# Patient Record
Sex: Female | Born: 1995 | Race: White | Hispanic: No | Marital: Married | Smoking: Former smoker
Health system: Southern US, Community
[De-identification: ages and names within clinical notes are randomized; demographics above are authoritative.]

## PROBLEM LIST (undated history)

## (undated) ENCOUNTER — Inpatient Hospital Stay: Payer: Self-pay

## (undated) ENCOUNTER — Ambulatory Visit: Discharge status: Against Medical Advice | Payer: Medicaid Other

## (undated) DIAGNOSIS — J45909 Unspecified asthma, uncomplicated: Secondary | ICD-10-CM

## (undated) DIAGNOSIS — K219 Gastro-esophageal reflux disease without esophagitis: Secondary | ICD-10-CM

## (undated) DIAGNOSIS — F99 Mental disorder, not otherwise specified: Secondary | ICD-10-CM

## (undated) DIAGNOSIS — E039 Hypothyroidism, unspecified: Secondary | ICD-10-CM

## (undated) DIAGNOSIS — O24419 Gestational diabetes mellitus in pregnancy, unspecified control: Secondary | ICD-10-CM

## (undated) DIAGNOSIS — F32A Depression, unspecified: Secondary | ICD-10-CM

## (undated) DIAGNOSIS — E119 Type 2 diabetes mellitus without complications: Secondary | ICD-10-CM

## (undated) DIAGNOSIS — F329 Major depressive disorder, single episode, unspecified: Secondary | ICD-10-CM

## (undated) DIAGNOSIS — F419 Anxiety disorder, unspecified: Secondary | ICD-10-CM

---
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
## 2013-01-18 ENCOUNTER — Emergency Department: Payer: Self-pay | Admitting: Emergency Medicine

## 2013-01-18 LAB — CBC WITH DIFFERENTIAL/PLATELET
Basophil %: 0.5 %
Eosinophil #: 0.3 10*3/uL (ref 0.0–0.7)
HCT: 40.8 % (ref 35.0–47.0)
Lymphocyte %: 18 %
MCH: 30.7 pg (ref 26.0–34.0)
MCHC: 35 g/dL (ref 32.0–36.0)
Monocyte %: 5.6 %
Neutrophil #: 7.2 10*3/uL — ABNORMAL HIGH (ref 1.4–6.5)
Neutrophil %: 73 %
Platelet: 227 10*3/uL (ref 150–440)
RBC: 4.66 10*6/uL (ref 3.80–5.20)
WBC: 9.9 10*3/uL (ref 3.6–11.0)

## 2013-01-18 LAB — COMPREHENSIVE METABOLIC PANEL
Anion Gap: 5 — ABNORMAL LOW (ref 7–16)
BUN: 12 mg/dL (ref 7–18)
Bilirubin,Total: 0.4 mg/dL (ref 0.2–1.0)
Calcium, Total: 9.1 mg/dL (ref 9.0–10.7)
Chloride: 109 mmol/L — ABNORMAL HIGH (ref 98–107)
Creatinine: 1.08 mg/dL (ref 0.60–1.30)
Glucose: 100 mg/dL — ABNORMAL HIGH (ref 65–99)
SGOT(AST): 36 U/L — ABNORMAL HIGH (ref 0–26)
Total Protein: 7.9 g/dL (ref 6.4–8.6)

## 2013-01-18 LAB — URINALYSIS, COMPLETE
Bacteria: NONE SEEN
Blood: NEGATIVE
Ketone: NEGATIVE
Leukocyte Esterase: NEGATIVE
Nitrite: NEGATIVE
Ph: 6 (ref 4.5–8.0)
Protein: NEGATIVE
RBC,UR: 2 /HPF (ref 0–5)
Specific Gravity: 1.025 (ref 1.003–1.030)

## 2013-01-18 LAB — LIPASE, BLOOD: Lipase: 229 U/L (ref 73–393)

## 2013-03-04 ENCOUNTER — Encounter: Payer: Self-pay | Admitting: Obstetrics & Gynecology

## 2013-04-15 ENCOUNTER — Encounter: Payer: Self-pay | Admitting: Obstetrics and Gynecology

## 2013-04-29 ENCOUNTER — Encounter: Payer: Self-pay | Admitting: Obstetrics and Gynecology

## 2013-04-29 LAB — CBC WITH DIFFERENTIAL/PLATELET
BASOS PCT: 0.2 %
Basophil #: 0 10*3/uL (ref 0.0–0.1)
EOS PCT: 2.2 %
Eosinophil #: 0.1 10*3/uL (ref 0.0–0.7)
HCT: 40 % (ref 35.0–47.0)
HGB: 14 g/dL (ref 12.0–16.0)
LYMPHS PCT: 24.6 %
Lymphocyte #: 1.4 10*3/uL (ref 1.0–3.6)
MCH: 31.5 pg (ref 26.0–34.0)
MCHC: 34.9 g/dL (ref 32.0–36.0)
MCV: 90 fL (ref 80–100)
MONOS PCT: 6.4 %
Monocyte #: 0.4 x10 3/mm (ref 0.2–0.9)
Neutrophil #: 3.8 10*3/uL (ref 1.4–6.5)
Neutrophil %: 66.6 %
Platelet: 202 10*3/uL (ref 150–440)
RBC: 4.43 10*6/uL (ref 3.80–5.20)
RDW: 13.6 % (ref 11.5–14.5)
WBC: 5.8 10*3/uL (ref 3.6–11.0)

## 2013-04-29 LAB — BASIC METABOLIC PANEL
ANION GAP: 7 (ref 7–16)
BUN: 9 mg/dL (ref 7–18)
Calcium, Total: 9.8 mg/dL (ref 9.0–10.7)
Chloride: 104 mmol/L (ref 98–107)
Co2: 25 mmol/L (ref 21–32)
Creatinine: 0.62 mg/dL (ref 0.60–1.30)
EGFR (African American): >60
GLUCOSE: 80 mg/dL (ref 65–99)
Osmolality: 270 (ref 275–301)
POTASSIUM: 3.9 mmol/L (ref 3.5–5.1)
SODIUM: 136 mmol/L (ref 136–145)

## 2013-06-04 ENCOUNTER — Emergency Department: Payer: Self-pay | Admitting: Emergency Medicine

## 2013-08-04 ENCOUNTER — Observation Stay: Payer: Self-pay | Admitting: Obstetrics and Gynecology

## 2013-09-17 ENCOUNTER — Ambulatory Visit: Payer: Self-pay | Admitting: Obstetrics & Gynecology

## 2013-09-17 LAB — CBC WITH DIFFERENTIAL/PLATELET
BASOS ABS: 0 10*3/uL (ref 0.0–0.1)
Basophil %: 0.2 %
Eosinophil #: 0.1 10*3/uL (ref 0.0–0.7)
Eosinophil %: 0.9 %
HCT: 34.4 % — ABNORMAL LOW (ref 35.0–47.0)
HGB: 11.6 g/dL — ABNORMAL LOW (ref 12.0–16.0)
LYMPHS ABS: 1.4 10*3/uL (ref 1.0–3.6)
LYMPHS PCT: 17.1 %
MCH: 32 pg (ref 26.0–34.0)
MCHC: 33.6 g/dL (ref 32.0–36.0)
MCV: 95 fL (ref 80–100)
MONOS PCT: 4.6 %
Monocyte #: 0.4 x10 3/mm (ref 0.2–0.9)
NEUTROS ABS: 6.5 10*3/uL (ref 1.4–6.5)
Neutrophil %: 77.2 %
PLATELETS: 198 10*3/uL (ref 150–440)
RBC: 3.62 10*6/uL — AB (ref 3.80–5.20)
RDW: 13.8 % (ref 11.5–14.5)
WBC: 8.4 10*3/uL (ref 3.6–11.0)

## 2013-09-17 LAB — RAPID HIV SCREEN (HIV 1/2 AB+AG)

## 2013-09-18 ENCOUNTER — Inpatient Hospital Stay: Payer: Self-pay | Admitting: Obstetrics & Gynecology

## 2013-09-18 LAB — GC/CHLAMYDIA PROBE AMP

## 2013-09-19 LAB — HEMATOCRIT: HCT: 28.9 % — AB (ref 35.0–47.0)

## 2014-06-14 NOTE — Op Note (Signed)
PATIENT NAME:  Shirley Hayes, Shirley Hayes MR#:  [id number] DATE OF BIRTH:  [birth year]  DATE OF PROCEDURE:  09/18/2013  PREOPERATIVE DIAGNOSIS:  Term pregnancy, breech presentation.  POSTOPERATIVE DIAGNOSIS: Term pregnancy, breech presentation.   PROCEDURE PERFORMED:  1.  Low transverse cesarean section.  2.  Placement of On-Q pain pump.   SURGEON: Annamarie Major, M.D.   ASSISTANT: [address]   ANESTHESIA: Spinal.   ESTIMATED BLOOD LOSS: 250 mL.   COMPLICATIONS: None.   FINDINGS: Normal tubes, ovaries, and uterus. Viable female infant weighing 7 pounds, 7 ounces with Apgars 8 and 9 at 1 and 10 minutes respectively in the frank breech presentation.   DISPOSITION: To the recovery room in stable condition.   TECHNIQUE: The patient is prepped and draped in the usual sterile fashion after adequate anesthesia is obtained. In the supine position on the operating room table, a scalpel was used to create a low transverse skin incision and the rectus fascia was dissected bilaterally using Mayo scissors. The rectus muscles are separated and peritoneum is penetrated, and the bladder is inferiorly dissected and retracted. A scalpel was used to create a low transverse hysterotomy incision that is then extended by blunt dissection. Amniotomy then reveals clear fluid. The buttocks are delivered in a sacrum anterior position and gentle traction is performed with the legs easily delivered as well. Once the axilla is reached, the arms are carefully swept across the chest and the head is easily delivered. The oropharynx is suctioned and umbilical cord is clamped and cut and the infant handed to the pediatric team.   Cord blood is obtained and the placenta is manually extracted. The uterus is externalized and cleansed of all membranes and debris using a moist sponge. Hysterotomy incision is closed with a running #1 Vicryl suture in a locking fashion with excellent hemostasis noted. Uterus was placed back in the  intra-abdominal cavity and the paracolic gutters are irrigated with warm saline. Re-examination reveals excellent hemostasis.   The peritoneum is closed with a Vicryl suture. Trocars are placed through the abdomen into the subfascial space and then the Silver soaker catheters associated with the On-Q pain pump are placed. The rectus fascia is closed with 0 Maxon suture with careful placement not to incorporate these catheters. The catheters are flushed with 5 mL each of bupivacaine and stabilized in place with Steri-Strips and a bandage.   Subcutaneous tissues are irrigated and hemostasis is assured using electrocautery. The skin is closed with 4-0 Vicryl suture in a subcuticular fashion followed by placement of a skin glue. The patient goes to the recovery room in stable condition. All sponge, instrument, and needle counts are correct.   ____________________________ R. Annamarie Major, MD rph:ts D: 09/18/2013 09:47:00 ET T: 09/18/2013 11:42:27 ET JOB#: [id number]  cc: Dierdre Searles, MD, <Dictator> Nadara Mustard MD ELECTRONICALLY SIGNED 09/20/2013 10:04

## 2014-09-29 ENCOUNTER — Ambulatory Visit: Payer: Self-pay | Admitting: General Surgery

## 2014-10-29 ENCOUNTER — Encounter: Payer: Self-pay | Admitting: *Deleted

## 2014-12-24 ENCOUNTER — Encounter: Payer: Self-pay | Admitting: *Deleted

## 2014-12-29 ENCOUNTER — Ambulatory Visit (INDEPENDENT_AMBULATORY_CARE_PROVIDER_SITE_OTHER): Payer: Medicaid Other | Admitting: General Surgery

## 2014-12-29 ENCOUNTER — Encounter: Payer: Self-pay | Admitting: General Surgery

## 2014-12-29 VITALS — BP 130/74 | HR 80 | Resp 12 | Ht 68.0 in | Wt 318.0 lb

## 2014-12-29 DIAGNOSIS — K8020 Calculus of gallbladder without cholecystitis without obstruction: Secondary | ICD-10-CM

## 2014-12-29 NOTE — Patient Instructions (Addendum)
Cholelithiasis Cholelithiasis (also called gallstones) is a form of gallbladder disease in which gallstones form in your gallbladder. The gallbladder is an organ that stores bile made in the liver, which helps digest fats. Gallstones begin as small crystals and slowly grow into stones. Gallstone pain occurs when the gallbladder spasms and a gallstone is blocking the duct. Pain can also occur when a stone passes out of the duct.  RISK FACTORS  Being female.   Having multiple pregnancies. Health care providers sometimes advise removing diseased gallbladders before future pregnancies.   Being obese.  Eating a diet heavy in fried foods and fat.   Being older than 60 years and increasing age.   Prolonged use of medicines containing female hormones.   Having diabetes mellitus.   Rapidly losing weight.   Having a family history of gallstones (heredity).  SYMPTOMS  Nausea.   Vomiting.  Abdominal pain.   Yellowing of the skin (jaundice).   Sudden pain. It may persist from several minutes to several hours.  Fever.   Tenderness to the touch. In some cases, when gallstones do not move into the bile duct, people have no pain or symptoms. These are called "silent" gallstones.  TREATMENT Silent gallstones do not need treatment. In severe cases, emergency surgery may be required. Options for treatment include:  Surgery to remove the gallbladder. This is the most common treatment.  Medicines. These do not always work and may take 6-12 months or more to work.  Shock wave treatment (extracorporeal biliary lithotripsy). In this treatment an ultrasound machine sends shock waves to the gallbladder to break gallstones into smaller pieces that can pass into the intestines or be dissolved by medicine. HOME CARE INSTRUCTIONS   Only take over-the-counter or prescription medicines for pain, discomfort, or fever as directed by your health care provider.   Follow a low-fat diet until  seen again by your health care provider. Fat causes the gallbladder to contract, which can result in pain.   Follow up with your health care provider as directed. Attacks are almost always recurrent and surgery is usually required for permanent treatment.  SEEK IMMEDIATE MEDICAL CARE IF:   Your pain increases and is not controlled by medicines.   You have a fever or persistent symptoms for more than 2-3 days.   You have a fever and your symptoms suddenly get worse.   You have persistent nausea and vomiting.  MAKE SURE YOU:   Understand these instructions.  Will watch your condition.  Will get help right away if you are not doing well or get worse.   This information is not intended to replace advice given to you by your health care provider. Make sure you discuss any questions you have with your health care provider.   Document Released: 02/03/2005 Document Revised: 10/10/2012 Document Reviewed: 08/01/2012 Elsevier Interactive Patient Education 2016 [employer].  Patient's surgery has been scheduled for 01-01-15 at [facility].

## 2014-12-29 NOTE — Progress Notes (Signed)
Patient ID: Shirley Hayes, female   DOB: 03/16/1995, 19 y.o.   MRN: [id number]  Chief Complaint  Patient presents with  . Other    Gallstones    HPI Shirley Hayes is a 19 y.o. female here today for an evaluation for gallstones.She had a ultrasound done on 09/10/2014. Patient has been having abdominal pain for three years off and on. Salads and greasy food trigger her pain. The pain last for about 30 min and radiates from RUQ to her back. Her first episode in July of this year lasted 4 hours. Subsequent ultrasound confirmed gallstones.  I have reviewed the history of present illness with the patient. HPI  History reviewed. No pertinent past medical history.  Past Surgical History  Procedure Laterality Date  . Cesarean section  [id number]  . Laparoscopy  02/22/2012    History reviewed. No pertinent family history.  Social History Social History  Substance Use Topics  . Smoking status: Current Every Day Smoker -- 1.00 packs/day for 3 years    Types: Cigarettes  . Smokeless tobacco: Never Used  . Alcohol Use: No    No Known Allergies  No current outpatient prescriptions on file.   No current facility-administered medications for this visit.    Review of Systems Review of Systems  Constitutional: Negative.   Respiratory: Negative.   Cardiovascular: Negative.   Gastrointestinal: Positive for abdominal pain.    Blood pressure 130/74, pulse 80, resp. rate 12, height 5' 8" (1.727 m), weight 318 lb (144.244 kg), last menstrual period 11/28/2014.  Physical Exam Physical Exam  Constitutional: She is oriented to person, place, and time. She appears well-developed and well-nourished.  Eyes: Conjunctivae are normal. No scleral icterus.  Neck: Neck supple.  Cardiovascular: Normal rate, regular rhythm and normal heart sounds.   Pulmonary/Chest: Effort normal.  Abdominal: Soft. Normal appearance and bowel sounds are normal. There is no hepatomegaly. There is no  tenderness.  Lymphadenopathy:    She has no cervical adenopathy.  Neurological: She is alert and oriented to person, place, and time.  Skin: Skin is warm and dry.    Data Reviewed Progress notes and ultrasound reviewed.  Assessment    Cholelithiasis, confirmed with recent ultrasound revealing gallstones. Patient is recurrently symptomatic with biliary colic. Options of management discussed, recommended laparoscopy and cholecystectomy.       Plan    Laparoscopic Cholecystectomy with Intraoperative Cholangiogram. The procedure, including it's potential risks and complications (including but not limited to infection, bleeding, injury to intra-abdominal organs or bile ducts, bile leak, poor cosmetic result, sepsis and death) were discussed with the patient in detail. Non-operative options, including their inherent risks (acute calculous cholecystitis with possible choledocholithiasis or gallstone pancreatitis, with the risk of ascending cholangitis, sepsis, and death) were discussed as well. The patient expressed and understanding of what we discussed and wishes to proceed with laparoscopic cholecystectomy. The patient further understands that if it is technically not possible, or it is unsafe to proceed laparoscopically, that I will convert to an open cholecystectomy.     PCP: Chelsa Holland, MD  Jowana Thumma G 12/29/2014, 11:40 AM    

## 2014-12-29 NOTE — Addendum Note (Signed)
Addended by: Kieth Brightly on: 12/29/2014 11:53 AM   Modules accepted: Orders

## 2014-12-30 ENCOUNTER — Other Ambulatory Visit: Payer: Self-pay

## 2015-01-01 ENCOUNTER — Encounter
Admission: RE | Disposition: A | Discharge status: Home / Self Care | Payer: Self-pay | Source: Ambulatory Visit | Attending: General Surgery

## 2015-01-01 ENCOUNTER — Ambulatory Visit
Admission: RE | Admit: 2015-01-01 | Discharge: 2015-01-01 | Disposition: A | Discharge status: Home / Self Care | Payer: Medicaid Other | Source: Ambulatory Visit | Attending: General Surgery | Admitting: General Surgery

## 2015-01-01 ENCOUNTER — Ambulatory Visit: Payer: Medicaid Other

## 2015-01-01 ENCOUNTER — Ambulatory Visit: Payer: Medicaid Other | Admitting: Anesthesiology

## 2015-01-01 ENCOUNTER — Encounter: Payer: Self-pay | Admitting: *Deleted

## 2015-01-01 DIAGNOSIS — K8010 Calculus of gallbladder with chronic cholecystitis without obstruction: Secondary | ICD-10-CM

## 2015-01-01 DIAGNOSIS — F17210 Nicotine dependence, cigarettes, uncomplicated: Secondary | ICD-10-CM | POA: Insufficient documentation

## 2015-01-01 DIAGNOSIS — K439 Ventral hernia without obstruction or gangrene: Secondary | ICD-10-CM | POA: Insufficient documentation

## 2015-01-01 DIAGNOSIS — K8020 Calculus of gallbladder without cholecystitis without obstruction: Secondary | ICD-10-CM

## 2015-01-01 DIAGNOSIS — Z6841 Body Mass Index (BMI) 40.0 and over, adult: Secondary | ICD-10-CM | POA: Insufficient documentation

## 2015-01-01 DIAGNOSIS — K219 Gastro-esophageal reflux disease without esophagitis: Secondary | ICD-10-CM | POA: Insufficient documentation

## 2015-01-01 DIAGNOSIS — J45909 Unspecified asthma, uncomplicated: Secondary | ICD-10-CM | POA: Diagnosis not present

## 2015-01-01 HISTORY — PX: CHOLECYSTECTOMY: SHX55

## 2015-01-01 LAB — POCT PREGNANCY, URINE: Preg Test, Ur: NEGATIVE

## 2015-01-01 SURGERY — LAPAROSCOPIC CHOLECYSTECTOMY WITH INTRAOPERATIVE CHOLANGIOGRAM
Anesthesia: General | Site: Abdomen | Wound class: Clean Contaminated

## 2015-01-01 MED ORDER — CEFAZOLIN SODIUM 10 G IJ SOLR
3.0000 g | INTRAMUSCULAR | Status: AC
  Administered 2015-01-01: 3 g via INTRAVENOUS
  Filled 2015-01-01: qty 3000

## 2015-01-01 MED ORDER — SUCCINYLCHOLINE CHLORIDE 20 MG/ML IJ SOLN
INTRAMUSCULAR | Status: DC | PRN
  Administered 2015-01-01: 100 mg via INTRAVENOUS

## 2015-01-01 MED ORDER — NEOSTIGMINE METHYLSULFATE 10 MG/10ML IV SOLN
INTRAVENOUS | Status: DC | PRN
  Administered 2015-01-01: 4 mg via INTRAVENOUS

## 2015-01-01 MED ORDER — OXYCODONE-ACETAMINOPHEN 5-325 MG PO TABS
ORAL_TABLET | ORAL | Status: AC
  Filled 2015-01-01: qty 1

## 2015-01-01 MED ORDER — ACETAMINOPHEN 10 MG/ML IV SOLN
INTRAVENOUS | Status: AC
  Filled 2015-01-01: qty 100

## 2015-01-01 MED ORDER — MIDAZOLAM HCL 5 MG/5ML IJ SOLN
INTRAMUSCULAR | Status: DC | PRN
Start: 2015-01-01 — End: 2015-01-01
  Administered 2015-01-01: 2 mg via INTRAVENOUS

## 2015-01-01 MED ORDER — SODIUM CHLORIDE 0.9 % IV SOLN
INTRAVENOUS | Status: DC | PRN
  Administered 2015-01-01: 10 mL

## 2015-01-01 MED ORDER — GLYCOPYRROLATE 0.2 MG/ML IJ SOLN
INTRAMUSCULAR | Status: DC | PRN
  Administered 2015-01-01: 0.6 mg via INTRAVENOUS

## 2015-01-01 MED ORDER — ROCURONIUM BROMIDE 100 MG/10ML IV SOLN
INTRAVENOUS | Status: DC | PRN
  Administered 2015-01-01: 30 mg via INTRAVENOUS
  Administered 2015-01-01 (×2): 10 mg via INTRAVENOUS

## 2015-01-01 MED ORDER — FENTANYL CITRATE (PF) 100 MCG/2ML IJ SOLN
25.0000 ug | INTRAMUSCULAR | Status: DC | PRN
  Administered 2015-01-01 (×2): 25 ug via INTRAVENOUS

## 2015-01-01 MED ORDER — DEXAMETHASONE SODIUM PHOSPHATE 10 MG/ML IJ SOLN
INTRAMUSCULAR | Status: DC | PRN
  Administered 2015-01-01: 8 mg via INTRAVENOUS

## 2015-01-01 MED ORDER — LIDOCAINE HCL (CARDIAC) 20 MG/ML IV SOLN
INTRAVENOUS | Status: DC | PRN
  Administered 2015-01-01: 80 mg via INTRAVENOUS

## 2015-01-01 MED ORDER — OXYCODONE-ACETAMINOPHEN 5-325 MG PO TABS
1.0000 | ORAL_TABLET | ORAL | Status: DC | PRN
  Administered 2015-01-01: 1 via ORAL

## 2015-01-01 MED ORDER — ONDANSETRON HCL 4 MG/2ML IJ SOLN
INTRAMUSCULAR | Status: DC | PRN
  Administered 2015-01-01: 4 mg via INTRAVENOUS

## 2015-01-01 MED ORDER — FAMOTIDINE 20 MG PO TABS
20.0000 mg | ORAL_TABLET | Freq: Once | ORAL | Status: AC
  Administered 2015-01-01: 20 mg via ORAL

## 2015-01-01 MED ORDER — ACETAMINOPHEN 10 MG/ML IV SOLN
INTRAVENOUS | Status: DC | PRN
  Administered 2015-01-01: 1000 mg via INTRAVENOUS

## 2015-01-01 MED ORDER — FENTANYL CITRATE (PF) 250 MCG/5ML IJ SOLN
INTRAMUSCULAR | Status: DC | PRN
  Administered 2015-01-01 (×6): 50 ug via INTRAVENOUS

## 2015-01-01 MED ORDER — PROMETHAZINE HCL 25 MG/ML IJ SOLN: 6.2500 mg | INTRAMUSCULAR | Status: DC | PRN

## 2015-01-01 MED ORDER — LACTATED RINGERS IV SOLN
INTRAVENOUS | Status: DC
  Administered 2015-01-01 (×2): via INTRAVENOUS

## 2015-01-01 MED ORDER — FENTANYL CITRATE (PF) 100 MCG/2ML IJ SOLN
INTRAMUSCULAR | Status: AC
  Administered 2015-01-01: 25 ug via INTRAVENOUS
  Filled 2015-01-01: qty 2

## 2015-01-01 MED ORDER — SODIUM CHLORIDE 0.9 % IJ SOLN
INTRAMUSCULAR | Status: AC
  Filled 2015-01-01: qty 50

## 2015-01-01 MED ORDER — KETOROLAC TROMETHAMINE 30 MG/ML IJ SOLN
INTRAMUSCULAR | Status: DC | PRN
  Administered 2015-01-01: 30 mg via INTRAVENOUS

## 2015-01-01 MED ORDER — PROPOFOL 10 MG/ML IV BOLUS
INTRAVENOUS | Status: DC | PRN
  Administered 2015-01-01: 200 mg via INTRAVENOUS

## 2015-01-01 MED ORDER — FAMOTIDINE 20 MG PO TABS
ORAL_TABLET | ORAL | Status: AC
  Administered 2015-01-01: 20 mg via ORAL
  Filled 2015-01-01: qty 1

## 2015-01-01 MED ORDER — OXYCODONE-ACETAMINOPHEN 5-325 MG PO TABS: 1.0000 | ORAL_TABLET | ORAL | Status: DC | PRN

## 2015-01-01 MED ORDER — LACTATED RINGERS IR SOLN
Status: DC | PRN
  Administered 2015-01-01: 500 mL

## 2015-01-01 SURGICAL SUPPLY — 39 items
ANCHOR TIS RET SYS 235ML (MISCELLANEOUS) ×3 IMPLANT
APPLICATOR SURGIFLO (MISCELLANEOUS) IMPLANT
APPLIER CLIP LOGIC TI 5 (MISCELLANEOUS) ×3 IMPLANT
BLADE SURG 11 STRL SS SAFETY (MISCELLANEOUS) ×3 IMPLANT
CANISTER SUCT 1200ML W/VALVE (MISCELLANEOUS) ×3 IMPLANT
CANNULA DILATOR 10 W/SLV (CANNULA) ×2 IMPLANT
CANNULA DILATOR 10MM W/SLV (CANNULA) ×1
CATH CHOLANG 76X19 KUMAR (CATHETERS) ×3 IMPLANT
CHLORAPREP W/TINT 26ML (MISCELLANEOUS) ×3 IMPLANT
CLOSURE WOUND 1/2 X4 (GAUZE/BANDAGES/DRESSINGS) ×1
DEFOGGER SCOPE WARMER CLEARIFY (MISCELLANEOUS) ×3 IMPLANT
DEVICE SECURE STRAP 25 ABSORB (INSTRUMENTS) ×3 IMPLANT
DRAPE C-ARM XRAY 36X54 (DRAPES) ×3 IMPLANT
DRAPE INCISE IOBAN 66X45 STRL (DRAPES) ×3 IMPLANT
DRESSING TELFA 4X3 1S ST N-ADH (GAUZE/BANDAGES/DRESSINGS) ×3 IMPLANT
DRSG TEGADERM 2-3/8X2-3/4 SM (GAUZE/BANDAGES/DRESSINGS) ×12 IMPLANT
GLOVE BIO SURGEON STRL SZ7 (GLOVE) ×3 IMPLANT
GOWN STRL REUS W/ TWL LRG LVL3 (GOWN DISPOSABLE) ×3 IMPLANT
GOWN STRL REUS W/TWL LRG LVL3 (GOWN DISPOSABLE) ×6
GRASPER SUT TROCAR 14GX15 (MISCELLANEOUS) ×3 IMPLANT
HEMOSTAT SURGICEL 2X3 (HEMOSTASIS) IMPLANT
IRRIGATION STRYKERFLOW (MISCELLANEOUS) ×1 IMPLANT
IRRIGATOR STRYKERFLOW (MISCELLANEOUS) ×3
IV LACTATED RINGERS 1000ML (IV SOLUTION) ×3 IMPLANT
KIT RM TURNOVER STRD PROC AR (KITS) ×3 IMPLANT
LABEL OR SOLS (LABEL) ×3 IMPLANT
NDL INSUFF ACCESS 14 VERSASTEP (NEEDLE) ×3 IMPLANT
PACK LAP CHOLECYSTECTOMY (MISCELLANEOUS) ×3 IMPLANT
PAD GROUND ADULT SPLIT (MISCELLANEOUS) ×3 IMPLANT
SCISSORS METZENBAUM CVD 33 (INSTRUMENTS) ×3 IMPLANT
SLEEVE ENDOPATH XCEL 5M (ENDOMECHANICALS) ×6 IMPLANT
SPOGE SURGIFLO 8M (HEMOSTASIS)
SPONGE SURGIFLO 8M (HEMOSTASIS) IMPLANT
STRIP CLOSURE SKIN 1/2X4 (GAUZE/BANDAGES/DRESSINGS) ×2 IMPLANT
SUT VIC AB 0 SH 27 (SUTURE) ×3 IMPLANT
SUT VIC AB 4-0 FS2 27 (SUTURE) ×6 IMPLANT
SWABSTK COMLB BENZOIN TINCTURE (MISCELLANEOUS) ×3 IMPLANT
TROCAR XCEL NON-BLD 5MMX100MML (ENDOMECHANICALS) ×3 IMPLANT
TUBING INSUFFLATOR HI FLOW (MISCELLANEOUS) ×3 IMPLANT

## 2015-01-01 NOTE — Interval H&P Note (Signed)
History and Physical Interval Note:  01/01/2015 10:35 AM  Shirley Hayes  has presented today for surgery, with the diagnosis of gallstones  The various methods of treatment have been discussed with the patient and family. After consideration of risks, benefits and other options for treatment, the patient has consented to  Procedure(s): LAPAROSCOPIC CHOLECYSTECTOMY WITH INTRAOPERATIVE CHOLANGIOGRAM (N/A) as a surgical intervention .  [name] history has been reviewed, patient examined, no change in status, stable for surgery.  I have reviewed the patient's chart and labs.  Questions were answered to the patient's satisfaction.     Mario Voong G

## 2015-01-01 NOTE — Progress Notes (Signed)
Negative urine pregnancy test.

## 2015-01-01 NOTE — Anesthesia Procedure Notes (Signed)
Procedure Name: Intubation Date/Time: 01/01/2015 11:10 AM Performed by: Chong Sicilian Pre-anesthesia Checklist: Patient identified, Emergency Drugs available, Suction available, Patient being monitored and Timeout performed Patient Re-evaluated:Patient Re-evaluated prior to inductionOxygen Delivery Method: Circle system utilized Preoxygenation: Pre-oxygenation with 100% oxygen Intubation Type: IV induction Ventilation: Mask ventilation without difficulty Laryngoscope Size: Mac and 3 Tube type: Oral Tube size: 7.0 mm Number of attempts: 1 Airway Equipment and Method: Stylet Placement Confirmation: ETT inserted through vocal cords under direct vision,  positive ETCO2 and breath sounds checked- equal and bilateral Secured at: 21 cm Tube secured with: Tape Dental Injury: Teeth and Oropharynx as per pre-operative assessment

## 2015-01-01 NOTE — Op Note (Signed)
Preop diagnosis: Cholelithiasis  Post op diagnosis: Same. Small ventral hernia lower midline  Operation: Laparoscopy cholecystectomy and intraoperative cholangiogram. Repair of a small ventral hernia  Surgeon: S.G.Jahid Weida    Assistant:     Anesthesia: Gen.  Complications: None  EBL: Less than 25 mL  Drains: None  Description: Patient was put to sleep in supine position the operating table. Abdomen was prepped and draped as sterile field. Timeout was performed. Initial port incision was made at the above the umbilicus and Veress needle   was position in the peritoneal cavity and verified of the hanging drop method. 10 mm port was placed after pneumoperitoneum was obtained. Camera was introduced and epigastric and 2 lateral 5 mm ports were placed. Gallbladder was mildly distended but had no adhesions or any chronic changes. With cephalad traction the Hartman's pouch was pulled up and angled scope was then used to visualize the cystic duct and common duct area. The cystic duct was then freed as also this cystic artery. Kumar clamp and catheter were positioned and cholangiogram was performed. The visualization was faint but didn't fill the cystic duct or hepatic duct and proximal radicals and also the common bile duct with no obstruction to flow into the duodenum. The catheter was used to decompress the gallbladder and then removed. Cystic duct and cystic artery were hemoclipped and cut gallbladder was dissected free from its bed using cautery for control of bleeding. The area was irrigated with small amount of saline and suctioned out. Clips were all noted be intact. The gallbladder was placed in a retrieval bag and brought out through the umbilical port site. It did contain now on a large 2 cm stone - of small stones packed. In the course of the visualize any umbilical port side to remove the gallbladder it was evident the patient had a small hernia within the peritoneum in the lower midline area  likely from a previous transverse pelvic incision. A small sleeve omentum was herniating into this. The defect was probably about a centimeter and a half long. It appeared to contain only the peritoneum and not so much the muscle anteriorly. This was subsequently closed with the use of secure strap. In view of the potentially contaminated surgery of mesh was not used in a sinus the hernial opening was fairly small and involved only the peritoneum. Fascial opening at the umbilicus were closed with 0 Vicryl in the skin incisions closed with subcuticular 4-0 Vicryl reinforced with Steri-Strips. Dry sterile dressings were placed and patient subsequently returned recovery room in stable condition.

## 2015-01-01 NOTE — H&P (View-Only) (Signed)
Patient ID: Shirley Hayes, female   DOB: 10-Jan-1996, 19 y.o.   MRN: [id number]  Chief Complaint  Patient presents with  . Other    Gallstones    HPI Shirley Hayes is a 19 y.o. female here today for an evaluation for gallstones.She had a ultrasound done on 09/10/2014. Patient has been having abdominal pain for three years off and on. Salads and greasy food trigger her pain. The pain last for about 30 min and radiates from RUQ to her back. Her first episode in July of this year lasted 4 hours. Subsequent ultrasound confirmed gallstones.  I have reviewed the history of present illness with the patient. HPI  History reviewed. No pertinent past medical history.  Past Surgical History  Procedure Laterality Date  . Cesarean section  [id number]  . Laparoscopy  02/22/2012    History reviewed. No pertinent family history.  Social History Social History  Substance Use Topics  . Smoking status: Current Every Day Smoker -- 1.00 packs/day for 3 years    Types: Cigarettes  . Smokeless tobacco: Never Used  . Alcohol Use: No    No Known Allergies  No current outpatient prescriptions on file.   No current facility-administered medications for this visit.    Review of Systems Review of Systems  Constitutional: Negative.   Respiratory: Negative.   Cardiovascular: Negative.   Gastrointestinal: Positive for abdominal pain.    Blood pressure 130/74, pulse 80, resp. rate 12, height 5\' 8"  (1.727 m), weight 318 lb (144.244 kg), last menstrual period 11/28/2014.  Physical Exam Physical Exam  Constitutional: She is oriented to person, place, and time. She appears well-developed and well-nourished.  Eyes: Conjunctivae are normal. No scleral icterus.  Neck: Neck supple.  Cardiovascular: Normal rate, regular rhythm and normal heart sounds.   Pulmonary/Chest: Effort normal.  Abdominal: Soft. Normal appearance and bowel sounds are normal. There is no hepatomegaly. There is no  tenderness.  Lymphadenopathy:    She has no cervical adenopathy.  Neurological: She is alert and oriented to person, place, and time.  Skin: Skin is warm and dry.    Data Reviewed Progress notes and ultrasound reviewed.  Assessment    Cholelithiasis, confirmed with recent ultrasound revealing gallstones. Patient is recurrently symptomatic with biliary colic. Options of management discussed, recommended laparoscopy and cholecystectomy.       Plan    Laparoscopic Cholecystectomy with Intraoperative Cholangiogram. The procedure, including it's potential risks and complications (including but not limited to infection, bleeding, injury to intra-abdominal organs or bile ducts, bile leak, poor cosmetic result, sepsis and death) were discussed with the patient in detail. Non-operative options, including their inherent risks (acute calculous cholecystitis with possible choledocholithiasis or gallstone pancreatitis, with the risk of ascending cholangitis, sepsis, and death) were discussed as well. The patient expressed and understanding of what we discussed and wishes to proceed with laparoscopic cholecystectomy. The patient further understands that if it is technically not possible, or it is unsafe to proceed laparoscopically, that I will convert to an open cholecystectomy.     PCP: Imelda Pillow, MD  Gerlene Burdock G 12/29/2014, 11:40 AM

## 2015-01-01 NOTE — Transfer of Care (Signed)
Immediate Anesthesia Transfer of Care Note  Patient: Shirley Hayes  Procedure(s) Performed: Procedure(s): LAPAROSCOPIC CHOLECYSTECTOMY WITH INTRAOPERATIVE CHOLANGIOGRAM (N/A)  Patient Location: PACU  Anesthesia Type:General  Level of Consciousness: awake, alert  and oriented  Airway & Oxygen Therapy: Patient Spontanous Breathing and Patient connected to face mask oxygen  Post-op Assessment: Report given to RN and Post -op Vital signs reviewed and stable  Post vital signs: Reviewed and stable  Last Vitals: 12:52 98hr 100% sat 21resp 142/82 99.7 temp Filed Vitals:   01/01/15 0957  BP: 101/76  Pulse: 114  Temp: 36.6 C  Resp: 16    Complications: No apparent anesthesia complications

## 2015-01-01 NOTE — Anesthesia Preprocedure Evaluation (Signed)
Anesthesia Evaluation  Patient identified by MRN, date of birth, ID band Patient awake    Reviewed: Allergy & Precautions, H&P , NPO status , Patient's Chart, lab work & pertinent test results, reviewed documented beta blocker date and time   History of Anesthesia Complications Negative for: history of anesthetic complications  Airway Mallampati: II  TM Distance: >3 FB Neck ROM: full    Dental no notable dental hx. (+) Chipped, Missing   Pulmonary neg shortness of breath, asthma (exercise induced, no rescue inhaler use in 2 years) , neg sleep apnea, neg COPD, neg recent URI, Current Smoker,    Pulmonary exam normal breath sounds clear to auscultation       Cardiovascular Exercise Tolerance: Good negative cardio ROS Normal cardiovascular exam Rhythm:regular Rate:Normal     Neuro/Psych negative neurological ROS  negative psych ROS   GI/Hepatic Neg liver ROS, GERD  Poorly Controlled,  Endo/Other  neg diabetesMorbid obesity  Renal/GU negative Renal ROS  negative genitourinary   Musculoskeletal   Abdominal   Peds  Hematology negative hematology ROS (+)   Anesthesia Other Findings History reviewed. No pertinent past medical history.   Reproductive/Obstetrics negative OB ROS                             Anesthesia Physical Anesthesia Plan  ASA: III  Anesthesia Plan: General   Post-op Pain Management:    Induction:   Airway Management Planned:   Additional Equipment:   Intra-op Plan:   Post-operative Plan:   Informed Consent: I have reviewed the patients History and Physical, chart, labs and discussed the procedure including the risks, benefits and alternatives for the proposed anesthesia with the patient or authorized representative who has indicated his/her understanding and acceptance.   Dental Advisory Given  Plan Discussed with: Anesthesiologist, CRNA and  Surgeon  Anesthesia Plan Comments:         Anesthesia Quick Evaluation

## 2015-01-01 NOTE — Discharge Instructions (Signed)

## 2015-01-02 LAB — SURGICAL PATHOLOGY

## 2015-01-02 NOTE — Anesthesia Postprocedure Evaluation (Signed)
  Anesthesia Post-op Note  Patient: Shirley Hayes  Procedure(s) Performed: Procedure(s): LAPAROSCOPIC CHOLECYSTECTOMY WITH INTRAOPERATIVE CHOLANGIOGRAM, laparoscopic incisional hernia repair (N/A)  Anesthesia type:General  Patient location: PACU  Post pain: Pain level controlled  Post assessment: Post-op Vital signs reviewed, Patient's Cardiovascular Status Stable, Respiratory Function Stable, Patent Airway and No signs of Nausea or vomiting  Post vital signs: Reviewed and stable  Last Vitals:  Filed Vitals:   01/01/15 1445  BP: 147/78  Pulse: 107  Temp: 36.7 C  Resp: 16    Level of consciousness: awake, alert  and patient cooperative  Complications: No apparent anesthesia complications

## 2015-01-08 ENCOUNTER — Ambulatory Visit (INDEPENDENT_AMBULATORY_CARE_PROVIDER_SITE_OTHER): Payer: Medicaid Other | Admitting: General Surgery

## 2015-01-08 ENCOUNTER — Encounter: Payer: Self-pay | Admitting: General Surgery

## 2015-01-08 VITALS — BP 120/74 | HR 78 | Resp 14 | Ht 68.0 in | Wt 319.0 lb

## 2015-01-08 DIAGNOSIS — K8020 Calculus of gallbladder without cholecystitis without obstruction: Secondary | ICD-10-CM

## 2015-01-08 NOTE — Patient Instructions (Signed)
The patient is aware to call back for any questions or concerns.  

## 2015-01-08 NOTE — Progress Notes (Signed)
Here today for postoperative visit, laparoscopic cholecystectomy was on 01-01-15. She states she is still sore, and she has not started her menstrual period. She is however much better last 1-2 days I have reviewed the history of present illness with the patient.  Port site incisions are healing well, clean with no signs of infection. Abdomen is soft, non tender other than incisional pain. Bowel sounds normoactive.  Pt advcised of operative findings. Follow up as needed. No restrictions.

## 2015-01-22 ENCOUNTER — Ambulatory Visit: Payer: Medicaid Other | Admitting: General Surgery

## 2015-01-26 ENCOUNTER — Encounter: Payer: Self-pay | Admitting: General Surgery

## 2015-04-08 ENCOUNTER — Encounter: Payer: Self-pay | Admitting: Nurse Practitioner

## 2015-07-30 LAB — OB RESULTS CONSOLE HEPATITIS B SURFACE ANTIGEN: Hepatitis B Surface Ag: NEGATIVE

## 2015-07-30 LAB — OB RESULTS CONSOLE ABO/RH: RH Type: NEGATIVE

## 2015-07-30 LAB — OB RESULTS CONSOLE HIV ANTIBODY (ROUTINE TESTING): HIV: NONREACTIVE

## 2015-07-30 LAB — OB RESULTS CONSOLE RUBELLA ANTIBODY, IGM: Rubella: NON-IMMUNE/NOT IMMUNE

## 2015-07-30 LAB — OB RESULTS CONSOLE VARICELLA ZOSTER ANTIBODY, IGG: Varicella: NON-IMMUNE/NOT IMMUNE

## 2015-08-26 LAB — OB RESULTS CONSOLE GBS: STREP GROUP B AG: NEGATIVE

## 2015-08-29 ENCOUNTER — Encounter: Payer: Self-pay | Admitting: *Deleted

## 2015-08-29 ENCOUNTER — Observation Stay
Admission: EM | Admit: 2015-08-29 | Discharge: 2015-08-29 | Disposition: A | Discharge status: Home / Self Care | Payer: Medicaid Other | Attending: Obstetrics & Gynecology | Admitting: Obstetrics & Gynecology

## 2015-08-29 DIAGNOSIS — O9989 Other specified diseases and conditions complicating pregnancy, childbirth and the puerperium: Secondary | ICD-10-CM | POA: Insufficient documentation

## 2015-08-29 DIAGNOSIS — Z87891 Personal history of nicotine dependence: Secondary | ICD-10-CM | POA: Insufficient documentation

## 2015-08-29 DIAGNOSIS — O99283 Endocrine, nutritional and metabolic diseases complicating pregnancy, third trimester: Secondary | ICD-10-CM | POA: Diagnosis not present

## 2015-08-29 DIAGNOSIS — E039 Hypothyroidism, unspecified: Secondary | ICD-10-CM | POA: Insufficient documentation

## 2015-08-29 DIAGNOSIS — Z3A38 38 weeks gestation of pregnancy: Secondary | ICD-10-CM | POA: Diagnosis not present

## 2015-08-29 DIAGNOSIS — O99213 Obesity complicating pregnancy, third trimester: Secondary | ICD-10-CM | POA: Diagnosis not present

## 2015-08-29 DIAGNOSIS — R030 Elevated blood-pressure reading, without diagnosis of hypertension: Secondary | ICD-10-CM | POA: Diagnosis not present

## 2015-08-29 DIAGNOSIS — R000 Tachycardia, unspecified: Secondary | ICD-10-CM | POA: Diagnosis not present

## 2015-08-29 DIAGNOSIS — Z6841 Body Mass Index (BMI) 40.0 and over, adult: Secondary | ICD-10-CM | POA: Diagnosis not present

## 2015-08-29 DIAGNOSIS — E669 Obesity, unspecified: Secondary | ICD-10-CM | POA: Insufficient documentation

## 2015-08-29 DIAGNOSIS — O24419 Gestational diabetes mellitus in pregnancy, unspecified control: Secondary | ICD-10-CM | POA: Diagnosis present

## 2015-08-29 HISTORY — DX: Hypothyroidism, unspecified: E03.9

## 2015-08-29 HISTORY — DX: Gestational diabetes mellitus in pregnancy, unspecified control: O24.419

## 2015-08-29 HISTORY — DX: Depression, unspecified: F32.A

## 2015-08-29 HISTORY — DX: Major depressive disorder, single episode, unspecified: F32.9

## 2015-08-29 HISTORY — DX: Type 2 diabetes mellitus without complications: E11.9

## 2015-08-29 HISTORY — DX: Mental disorder, not otherwise specified: F99

## 2015-08-29 LAB — COMPREHENSIVE METABOLIC PANEL
ALT: 46 U/L (ref 14–54)
ANION GAP: 9 (ref 5–15)
AST: 42 U/L — ABNORMAL HIGH (ref 15–41)
Albumin: 2.8 g/dL — ABNORMAL LOW (ref 3.5–5.0)
Alkaline Phosphatase: 311 U/L — ABNORMAL HIGH (ref 38–126)
BILIRUBIN TOTAL: 0.6 mg/dL (ref 0.3–1.2)
BUN: 9 mg/dL (ref 6–20)
CALCIUM: 9.2 mg/dL (ref 8.9–10.3)
CO2: 18 mmol/L — ABNORMAL LOW (ref 22–32)
Chloride: 108 mmol/L (ref 101–111)
Creatinine, Ser: 0.82 mg/dL (ref 0.44–1.00)
GFR calc Af Amer: >60 mL/min (ref >60)
Glucose, Bld: 97 mg/dL (ref 65–99)
POTASSIUM: 3.7 mmol/L (ref 3.5–5.1)
Sodium: 135 mmol/L (ref 135–145)
TOTAL PROTEIN: 7.1 g/dL (ref 6.5–8.1)

## 2015-08-29 LAB — CBC
HEMATOCRIT: 34.5 % — AB (ref 35.0–47.0)
HEMOGLOBIN: 12.2 g/dL (ref 12.0–16.0)
MCH: 31.6 pg (ref 26.0–34.0)
MCHC: 35.2 g/dL (ref 32.0–36.0)
MCV: 89.9 fL (ref 80.0–100.0)
Platelets: 182 10*3/uL (ref 150–440)
RBC: 3.84 MIL/uL (ref 3.80–5.20)
RDW: 15.7 % — AB (ref 11.5–14.5)
WBC: 5.8 10*3/uL (ref 3.6–11.0)

## 2015-08-29 LAB — URINALYSIS COMPLETE WITH MICROSCOPIC (ARMC ONLY)
BACTERIA UA: NONE SEEN
BILIRUBIN URINE: NEGATIVE
Glucose, UA: NEGATIVE mg/dL
HGB URINE DIPSTICK: NEGATIVE
KETONES UR: NEGATIVE mg/dL
LEUKOCYTES UA: NEGATIVE
NITRITE: NEGATIVE
PH: 5 (ref 5.0–8.0)
Protein, ur: 100 mg/dL — AB
SPECIFIC GRAVITY, URINE: 1.024 (ref 1.005–1.030)

## 2015-08-29 LAB — PROTEIN / CREATININE RATIO, URINE
CREATININE, URINE: 290 mg/dL
PROTEIN CREATININE RATIO: 0.24 mg/mg{creat} — AB (ref 0.00–0.15)
TOTAL PROTEIN, URINE: 71 mg/dL

## 2015-08-29 LAB — TYPE AND SCREEN
ABO/RH(D): O NEG
Antibody Screen: POSITIVE

## 2015-08-29 MED ORDER — ACETAMINOPHEN 325 MG PO TABS: 650.0000 mg | ORAL_TABLET | ORAL | Status: DC | PRN

## 2015-08-29 NOTE — Procedures (Signed)
NST interpretation:  FHT: 145 BPM Accels present Decels absent >20 minutes of observation Fetal movement present  Interpretation: Reactive, category 1  ----- Ranae Plumber, MD Attending Obstetrician and Gynecologist [facility]

## 2015-08-29 NOTE — OB Triage Note (Signed)
Shirley Hayes here for NST.  BP's elevated upon assessment, MD reviewed NST, lab orders placed for pre-eclampsia evaluation. Incorrect BP cuff on, cuff changed at 1053. Pt denies visual changes, epigastric pain, swelling, headaches. Reports positive fetal movement. 1+ reflexes, no clonus.

## 2015-08-29 NOTE — OB Triage Note (Signed)
Scheduled NST. Gestational diabetes. Shirley Hayes

## 2015-08-29 NOTE — Discharge Summary (Signed)
Shirley Hayes is a 20 y.o. female. She is at [redacted]w[redacted]d gestation dated by LMP c/w 19wk [place], [facility] 09/09/15.  Chief Complaint:  Here for outpatient NST due to Obesity, hypothyroidism, and possible GDM - poorly compliant patient  S: Resting comfortably. no CTX, no VB.no LOF,  Active fetal movement.   Denies HA SOB RUQ pain or change of vision Denies CP, SOB, palpitations   Maternal Medical History:   Past Medical History  Diagnosis Date  . Diabetes mellitus without complication (HCC)   . Gestational diabetes   . Hypothyroidism   . Mental disorder   . Depression     Past Surgical History  Procedure Laterality Date  . Cesarean section  [id number]  . Laparoscopy  02/22/2012  . Cholecystectomy N/A 01/01/2015    Procedure: LAPAROSCOPIC CHOLECYSTECTOMY WITH INTRAOPERATIVE CHOLANGIOGRAM, laparoscopic incisional hernia repair;  Surgeon: Kieth Brightly, MD;  Location: [facility] ORS;  Service: General;  Laterality: N/A;    No Known Allergies  Prior to Admission medications   Medication Sig Start Date End Date Taking? Authorizing Provider  FLUoxetine (PROZAC) 20 MG tablet Take 20 mg by mouth daily.   Yes Historical Provider, MD  levothyroxine (SYNTHROID, LEVOTHROID) 50 MCG tablet Take 50 mcg by mouth daily before breakfast.   Yes Historical Provider, MD  ranitidine (ZANTAC) 150 MG tablet Take 150 mg by mouth 2 (two) times daily.   Yes Historical Provider, MD     Prenatal care site: [facility]    Social History: She  reports that she has quit smoking. Her smoking use included Cigarettes. She has a 3 pack-year smoking history. She has never used smokeless tobacco. She reports that she does not drink alcohol or use illicit drugs.  Family History: hypertension  Review of Systems: A full review of systems was performed and negative except as noted in the HPI.     O:  BP 129/75 mmHg  Pulse 128  Temp(Src) 97.2 F (36.2 C) (Oral)  Resp 18  Ht 5' 7.5" (1.715 m)  Wt 134.718 kg  (297 lb)  BMI 45.80 kg/m2  LMP 12/03/2014  Patient Vitals for the past 24 hrs:  BP Temp Temp src Pulse Resp Height Weight  08/29/15 1223 129/75 mmHg - - (!) 128 - - -  08/29/15 1208 123/73 mmHg - - (!) 103 - - -  08/29/15 1152 121/77 mmHg - - (!) 105 - - -  08/29/15 1138 124/68 mmHg - - 97 - - -  08/29/15 1122 121/80 mmHg - - (!) 135 - - -  08/29/15 1107 135/80 mmHg - - (!) 116 - - -  08/29/15 1054 128/89 mmHg - - (!) 116 - - -  08/29/15 1052 (!) 141/109 mmHg - - (!) 107 - - -  08/29/15 1037 134/88 mmHg - - (!) 115 - - -  08/29/15 1022 130/90 mmHg - - (!) 110 - - -  08/29/15 1007 (!) 158/91 mmHg - - (!) 117 - - -  08/29/15 1006 (!) 142/92 mmHg - - (!) 114 - - -  08/29/15 0959 - - - - - 5' 7.5" (1.715 m) 134.718 kg (297 lb)  08/29/15 0948 (!) 131/100 mmHg 97.2 F (36.2 C) Oral (!) 130 18 - -   Results for orders placed or performed during the hospital encounter of 08/29/15 (from the past 48 hour(s))  Comprehensive metabolic panel   Collection Time: 08/29/15 10:56 AM  Result Value Ref Range   Sodium 135 135 - 145 mmol/L  Potassium 3.7 3.5 - 5.1 mmol/L   Chloride 108 101 - 111 mmol/L   CO2 18 (L) 22 - 32 mmol/L   Glucose, Bld 97 65 - 99 mg/dL   BUN 9 6 - 20 mg/dL   Creatinine, Ser 6.04 0.44 - 1.00 mg/dL   Calcium 9.2 8.9 - 54.0 mg/dL   Total Protein 7.1 6.5 - 8.1 g/dL   Albumin 2.8 (L) 3.5 - 5.0 g/dL   AST 42 (H) 15 - 41 U/L   ALT 46 14 - 54 U/L   Alkaline Phosphatase 311 (H) 38 - 126 U/L   Total Bilirubin 0.6 0.3 - 1.2 mg/dL   GFR calc non Af Amer >60 >60 mL/min   GFR calc Af Amer >60 >60 mL/min   Anion gap 9 5 - 15  CBC   Collection Time: 08/29/15 10:56 AM  Result Value Ref Range   WBC 5.8 3.6 - 11.0 K/uL   RBC 3.84 3.80 - 5.20 MIL/uL   Hemoglobin 12.2 12.0 - 16.0 g/dL   HCT 98.1 (L) 19.1 - 47.8 %   MCV 89.9 80.0 - 100.0 fL   MCH 31.6 26.0 - 34.0 pg   MCHC 35.2 32.0 - 36.0 g/dL   RDW 29.5 (H) 62.1 - 30.8 %   Platelets 182 150 - 440 K/uL  Type and screen [facility]   Collection Time: 08/29/15 10:56 AM  Result Value Ref Range   ABO/RH(D) PENDING    Antibody Screen PENDING    Sample Expiration 09/01/2015   Urinalysis complete, with microscopic ([facility] only)   Collection Time: 08/29/15 11:23 AM  Result Value Ref Range   Color, Urine AMBER (A) YELLOW   APPearance CLEAR (A) CLEAR   Glucose, UA NEGATIVE NEGATIVE mg/dL   Bilirubin Urine NEGATIVE NEGATIVE   Ketones, ur NEGATIVE NEGATIVE mg/dL   Specific Gravity, Urine 1.024 1.005 - 1.030   Hgb urine dipstick NEGATIVE NEGATIVE   pH 5.0 5.0 - 8.0   Protein, ur 100 (A) NEGATIVE mg/dL   Nitrite NEGATIVE NEGATIVE   Leukocytes, UA NEGATIVE NEGATIVE   RBC / HPF 0-5 0 - 5 RBC/hpf   WBC, UA 0-5 0 - 5 WBC/hpf   Bacteria, UA NONE SEEN NONE SEEN   Squamous Epithelial / LPF 0-5 (A) NONE SEEN   Mucous PRESENT   Protein / creatinine ratio, urine   Collection Time: 08/29/15 11:23 AM  Result Value Ref Range   Creatinine, Urine 290 mg/dL   Total Protein, Urine 71 mg/dL   Protein Creatinine Ratio 0.24 (H) 0.00 - 0.15 mg/mg[Cre]     Constitutional: NAD, AAOx3  HE/ENT: extraocular movements grossly intact, moist mucous membranes CV: RRR PULM: nl respiratory effort, CTABL     Abd: gravid, non-tender, non-distended, soft      Ext: Non-tender, Nonedmeatous   Psych: mood appropriate, speech normal Pelvic: deferred  FHT: 145, mod + accels, no decels TOCO: quiet    A/P:  20yo G3P2 @ 38.3 for scheduled outpatient NST and elevated blood pressures.   Labor: not present.   Fetal Wellbeing: Reassuring Cat 1 tracing.  See NST procedure note - reactive  Elevated blood pressures on admission but incorrect sized cuff on patient - once appropriate size placed BPs were normal.  ALT slightly elevated but not 2x normal range.  Tachycardia - asymptomatic  D/c home stable, precautions reviewed, follow-up as scheduled, Repeat CS scheduled for Thursday   ----- Ranae Plumber, MD Attending  Obstetrician and Gynecologist [facility]

## 2015-09-02 ENCOUNTER — Ambulatory Visit
Admission: RE | Admit: 2015-09-02 | Discharge: 2015-09-02 | Disposition: A | Discharge status: Home / Self Care | Payer: Medicaid Other | Source: Ambulatory Visit | Attending: Obstetrics and Gynecology | Admitting: Obstetrics and Gynecology

## 2015-09-02 DIAGNOSIS — Z01812 Encounter for preprocedural laboratory examination: Secondary | ICD-10-CM

## 2015-09-02 DIAGNOSIS — O34219 Maternal care for unspecified type scar from previous cesarean delivery: Secondary | ICD-10-CM | POA: Insufficient documentation

## 2015-09-02 DIAGNOSIS — Z01810 Encounter for preprocedural cardiovascular examination: Secondary | ICD-10-CM | POA: Insufficient documentation

## 2015-09-02 DIAGNOSIS — Z3A39 39 weeks gestation of pregnancy: Secondary | ICD-10-CM | POA: Insufficient documentation

## 2015-09-02 DIAGNOSIS — Z6841 Body Mass Index (BMI) 40.0 and over, adult: Secondary | ICD-10-CM

## 2015-09-02 DIAGNOSIS — O99213 Obesity complicating pregnancy, third trimester: Secondary | ICD-10-CM

## 2015-09-02 DIAGNOSIS — O24419 Gestational diabetes mellitus in pregnancy, unspecified control: Secondary | ICD-10-CM | POA: Insufficient documentation

## 2015-09-02 HISTORY — DX: Unspecified asthma, uncomplicated: J45.909

## 2015-09-02 HISTORY — DX: Anxiety disorder, unspecified: F41.9

## 2015-09-02 HISTORY — DX: Gastro-esophageal reflux disease without esophagitis: K21.9

## 2015-09-02 LAB — DIFFERENTIAL
BASOS ABS: 0 10*3/uL (ref 0–0.1)
Basophils Relative: 0 %
Eosinophils Absolute: 0 10*3/uL (ref 0–0.7)
Eosinophils Relative: 1 %
LYMPHS ABS: 1.5 10*3/uL (ref 1.0–3.6)
LYMPHS PCT: 22 %
MONOS PCT: 6 %
Monocytes Absolute: 0.4 10*3/uL (ref 0.2–0.9)
NEUTROS ABS: 4.7 10*3/uL (ref 1.4–6.5)
Neutrophils Relative %: 71 %

## 2015-09-02 LAB — CBC
HEMATOCRIT: 34.8 % — AB (ref 35.0–47.0)
HEMOGLOBIN: 12 g/dL (ref 12.0–16.0)
MCH: 31.2 pg (ref 26.0–34.0)
MCHC: 34.4 g/dL (ref 32.0–36.0)
MCV: 90.7 fL (ref 80.0–100.0)
Platelets: 188 10*3/uL (ref 150–440)
RBC: 3.84 MIL/uL (ref 3.80–5.20)
RDW: 15.5 % — AB (ref 11.5–14.5)
WBC: 6.6 10*3/uL (ref 3.6–11.0)

## 2015-09-02 LAB — TYPE AND SCREEN
ABO/RH(D): O NEG
Antibody Screen: POSITIVE
EXTEND SAMPLE REASON: UNDETERMINED

## 2015-09-02 NOTE — Patient Instructions (Signed)
  Your procedure is scheduled on:September 03, 2015 (Thursday) Report to  Labor and Delivery  (Third Floor) To find out your arrival time please call [phone] between 1PM - 3PM on ARRIVAL TIME 7:15 AM  Remember: Instructions that are not followed completely may result in serious medical risk, up to and including death, or upon the discretion of your surgeon and anesthesiologist your surgery may need to be rescheduled.    _x___ 1. Do not eat food or drink liquids after midnight. No gum chewing or hard candies.     _x__ 2. No Alcohol for 24 hours before or after surgery.   _x__3. No Smoking for 24 prior to surgery.   ____  4. Bring all medications with you on the day of surgery if instructed.    __x__ 5. Notify your doctor if there is any change in your medical condition     (cold, fever, infections).     Do not wear jewelry, make-up, hairpins, clips or nail polish.  Do not wear lotions, powders, or perfumes. You may wear deodorant.  Do not shave 48 hours prior to surgery. Men may shave face and neck.  Do not bring valuables to the hospital.    [facility] is not responsible for any belongings or valuables.               Contacts, dentures or bridgework may not be worn into surgery.  Leave your suitcase in the car. After surgery it may be brought to your room.  For patients admitted to the hospital, discharge time is determined by your treatment team.   Patients discharged the day of surgery will not be allowed to drive home.    Please read over the following fact sheets that you were given:   [facility] Preparing for Surgery and or MRSA Information   __ Take these medicines the morning of surgery with A SIP OF WATER:    1.   2.  3.  4.  5.  6.  ____ Fleet Enema (as directed)   ____ Use CHG Soap or sage wipes as directed on instruction sheet   ____ Use inhalers on the day of surgery and bring to hospital day of surgery  ____ Stop metformin 2 days prior to  surgery    ____ Take 1/2 of usual insulin dose the night before surgery and none on the morning of surgery            _x___ Stop aspirin or coumadin, or plavix (N/A)  _x__ Stop Anti-inflammatories such as Advil, Aleve, Ibuprofen, Motrin, Naproxen,          Naprosyn, Goodies powders or aspirin products. Ok to take Tylenol.   ____ Stop supplements until after surgery.    ____ Bring C-Pap to the hospital.

## 2015-09-03 ENCOUNTER — Inpatient Hospital Stay: Payer: Medicaid Other | Admitting: Anesthesiology

## 2015-09-03 ENCOUNTER — Inpatient Hospital Stay
Admission: RE | Admit: 2015-09-03 | Discharge: 2015-09-06 | DRG: 765 | Disposition: A | Discharge status: Home / Self Care | Payer: Medicaid Other | Source: Ambulatory Visit | Attending: Obstetrics and Gynecology | Admitting: Obstetrics and Gynecology

## 2015-09-03 ENCOUNTER — Encounter: Payer: Self-pay | Admitting: Anesthesiology

## 2015-09-03 ENCOUNTER — Encounter
Admission: RE | Disposition: A | Discharge status: Home / Self Care | Payer: Self-pay | Source: Ambulatory Visit | Attending: Obstetrics and Gynecology

## 2015-09-03 DIAGNOSIS — Z6841 Body Mass Index (BMI) 40.0 and over, adult: Secondary | ICD-10-CM | POA: Diagnosis not present

## 2015-09-03 DIAGNOSIS — O34211 Maternal care for low transverse scar from previous cesarean delivery: Secondary | ICD-10-CM | POA: Diagnosis present

## 2015-09-03 DIAGNOSIS — Z9049 Acquired absence of other specified parts of digestive tract: Secondary | ICD-10-CM | POA: Diagnosis not present

## 2015-09-03 DIAGNOSIS — Z8249 Family history of ischemic heart disease and other diseases of the circulatory system: Secondary | ICD-10-CM | POA: Diagnosis not present

## 2015-09-03 DIAGNOSIS — O99214 Obesity complicating childbirth: Secondary | ICD-10-CM | POA: Diagnosis present

## 2015-09-03 DIAGNOSIS — D62 Acute posthemorrhagic anemia: Secondary | ICD-10-CM | POA: Diagnosis present

## 2015-09-03 DIAGNOSIS — Z79899 Other long term (current) drug therapy: Secondary | ICD-10-CM

## 2015-09-03 DIAGNOSIS — Z3A39 39 weeks gestation of pregnancy: Secondary | ICD-10-CM | POA: Diagnosis not present

## 2015-09-03 DIAGNOSIS — O9902 Anemia complicating childbirth: Secondary | ICD-10-CM | POA: Diagnosis present

## 2015-09-03 DIAGNOSIS — Z9889 Other specified postprocedural states: Secondary | ICD-10-CM | POA: Diagnosis not present

## 2015-09-03 DIAGNOSIS — Z98891 History of uterine scar from previous surgery: Secondary | ICD-10-CM

## 2015-09-03 LAB — CHLAMYDIA/NGC RT PCR (ARMC ONLY)
Chlamydia Tr: NOT DETECTED
N GONORRHOEAE: NOT DETECTED

## 2015-09-03 LAB — GLUCOSE, CAPILLARY: Glucose-Capillary: 85 mg/dL (ref 65–99)

## 2015-09-03 LAB — RPR: RPR: NONREACTIVE

## 2015-09-03 LAB — HIV ANTIBODY (ROUTINE TESTING W REFLEX): HIV SCREEN 4TH GENERATION: NONREACTIVE

## 2015-09-03 SURGERY — Surgical Case
Anesthesia: Spinal

## 2015-09-03 MED ORDER — PHENYLEPHRINE HCL 10 MG/ML IJ SOLN
INTRAMUSCULAR | Status: DC | PRN
  Administered 2015-09-03 (×14): 100 ug via INTRAVENOUS

## 2015-09-03 MED ORDER — DEXTROSE 5 % IV SOLN
3.0000 g | INTRAVENOUS | Status: AC
  Administered 2015-09-03: 3 g via INTRAVENOUS
  Filled 2015-09-03: qty 3000

## 2015-09-03 MED ORDER — PRENATAL MULTIVITAMIN CH
1.0000 | ORAL_TABLET | Freq: Every day | ORAL | Status: DC
  Administered 2015-09-04 – 2015-09-06 (×3): 1 via ORAL
  Filled 2015-09-03 (×4): qty 1

## 2015-09-03 MED ORDER — BUPIVACAINE IN DEXTROSE 0.75-8.25 % IT SOLN
INTRATHECAL | Status: DC | PRN
  Administered 2015-09-03: 1.6 mL via INTRATHECAL

## 2015-09-03 MED ORDER — COCONUT OIL OIL: 1.0000 "application " | TOPICAL_OIL | Status: DC | PRN

## 2015-09-03 MED ORDER — MENTHOL 3 MG MT LOZG
1.0000 | LOZENGE | OROMUCOSAL | Status: DC | PRN
  Filled 2015-09-03: qty 9

## 2015-09-03 MED ORDER — NALBUPHINE HCL 10 MG/ML IJ SOLN
5.0000 mg | Freq: Once | INTRAMUSCULAR | Status: DC | PRN
Start: 2015-09-03 — End: 2015-09-06

## 2015-09-03 MED ORDER — KETOROLAC TROMETHAMINE 30 MG/ML IJ SOLN: 30.0000 mg | Freq: Four times a day (QID) | INTRAMUSCULAR | Status: AC | PRN

## 2015-09-03 MED ORDER — SCOPOLAMINE 1 MG/3DAYS TD PT72
1.0000 | MEDICATED_PATCH | Freq: Once | TRANSDERMAL | Status: AC
  Administered 2015-09-03: 1.5 mg via TRANSDERMAL
  Filled 2015-09-03: qty 1

## 2015-09-03 MED ORDER — SIMETHICONE 80 MG PO CHEW
80.0000 mg | CHEWABLE_TABLET | ORAL | Status: DC
  Administered 2015-09-03 – 2015-09-05 (×3): 80 mg via ORAL
  Filled 2015-09-03 (×2): qty 1

## 2015-09-03 MED ORDER — ACETAMINOPHEN 325 MG PO TABS: 650.0000 mg | ORAL_TABLET | ORAL | Status: DC | PRN

## 2015-09-03 MED ORDER — SODIUM CHLORIDE 0.9% FLUSH: 3.0000 mL | INTRAVENOUS | Status: DC | PRN

## 2015-09-03 MED ORDER — ONDANSETRON HCL 4 MG/2ML IJ SOLN
INTRAMUSCULAR | Status: DC | PRN
  Administered 2015-09-03: 4 mg via INTRAVENOUS

## 2015-09-03 MED ORDER — BUPIVACAINE HCL (PF) 0.5 % IJ SOLN
INTRAMUSCULAR | Status: DC | PRN
  Administered 2015-09-03: 10 mL

## 2015-09-03 MED ORDER — OXYTOCIN 40 UNITS IN LACTATED RINGERS INFUSION - SIMPLE MED
INTRAVENOUS | Status: AC
  Administered 2015-09-03: 40 [IU]
  Filled 2015-09-03: qty 1000

## 2015-09-03 MED ORDER — ONDANSETRON HCL 4 MG/2ML IJ SOLN
4.0000 mg | Freq: Once | INTRAMUSCULAR | Status: AC | PRN
  Administered 2015-09-03: 4 mg via INTRAVENOUS
  Filled 2015-09-03: qty 2

## 2015-09-03 MED ORDER — NALOXONE HCL 2 MG/2ML IJ SOSY
1.0000 ug/kg/h | PREFILLED_SYRINGE | INTRAVENOUS | Status: DC | PRN
  Filled 2015-09-03: qty 2

## 2015-09-03 MED ORDER — SIMETHICONE 80 MG PO CHEW
80.0000 mg | CHEWABLE_TABLET | Freq: Three times a day (TID) | ORAL | Status: DC
  Administered 2015-09-04 – 2015-09-06 (×7): 80 mg via ORAL
  Filled 2015-09-03 (×8): qty 1

## 2015-09-03 MED ORDER — MEPERIDINE HCL 25 MG/ML IJ SOLN: 6.2500 mg | INTRAMUSCULAR | Status: DC | PRN

## 2015-09-03 MED ORDER — FENTANYL CITRATE (PF) 100 MCG/2ML IJ SOLN: 25.0000 ug | INTRAMUSCULAR | Status: DC | PRN

## 2015-09-03 MED ORDER — OXYCODONE-ACETAMINOPHEN 5-325 MG PO TABS
2.0000 | ORAL_TABLET | ORAL | Status: DC | PRN
  Administered 2015-09-04: 2 via ORAL
  Filled 2015-09-03: qty 2

## 2015-09-03 MED ORDER — MORPHINE SULFATE (PF) 0.5 MG/ML IJ SOLN
INTRAMUSCULAR | Status: DC | PRN
  Administered 2015-09-03: .2 mg via INTRATHECAL

## 2015-09-03 MED ORDER — NALBUPHINE HCL 10 MG/ML IJ SOLN: 5.0000 mg | Freq: Once | INTRAMUSCULAR | Status: DC | PRN

## 2015-09-03 MED ORDER — BUPIVACAINE HCL (PF) 0.5 % IJ SOLN
10.0000 mL | Freq: Once | INTRAMUSCULAR | Status: DC
  Filled 2015-09-03: qty 30

## 2015-09-03 MED ORDER — LACTATED RINGERS IV SOLN
Freq: Once | INTRAVENOUS | Status: AC
  Administered 2015-09-03: 08:00:00 via INTRAVENOUS

## 2015-09-03 MED ORDER — SOD CITRATE-CITRIC ACID 500-334 MG/5ML PO SOLN
30.0000 mL | ORAL | Status: AC
  Administered 2015-09-03: 30 mL via ORAL
  Filled 2015-09-03: qty 30

## 2015-09-03 MED ORDER — OXYTOCIN 40 UNITS IN LACTATED RINGERS INFUSION - SIMPLE MED
2.5000 [IU]/h | INTRAVENOUS | Status: AC
  Filled 2015-09-03: qty 1000

## 2015-09-03 MED ORDER — SENNOSIDES-DOCUSATE SODIUM 8.6-50 MG PO TABS
2.0000 | ORAL_TABLET | ORAL | Status: DC
  Administered 2015-09-03 – 2015-09-05 (×3): 2 via ORAL
  Filled 2015-09-03 (×3): qty 2

## 2015-09-03 MED ORDER — LACTATED RINGERS IV SOLN
INTRAVENOUS | Status: DC
  Administered 2015-09-03 (×2): via INTRAVENOUS

## 2015-09-03 MED ORDER — DIBUCAINE 1 % RE OINT: 1.0000 "application " | TOPICAL_OINTMENT | RECTAL | Status: DC | PRN

## 2015-09-03 MED ORDER — OXYTOCIN 40 UNITS IN LACTATED RINGERS INFUSION - SIMPLE MED
INTRAVENOUS | Status: DC | PRN
  Administered 2015-09-03: 700 mL via INTRAVENOUS

## 2015-09-03 MED ORDER — BUPIVACAINE 0.25 % ON-Q PUMP SINGLE CATH 400 ML: 400.0000 mL | INJECTION | Status: DC

## 2015-09-03 MED ORDER — OXYCODONE-ACETAMINOPHEN 5-325 MG PO TABS
1.0000 | ORAL_TABLET | ORAL | Status: DC | PRN
  Administered 2015-09-04 – 2015-09-06 (×4): 1 via ORAL
  Filled 2015-09-03 (×4): qty 1

## 2015-09-03 MED ORDER — BUPIVACAINE 0.25 % ON-Q PUMP DUAL CATH 400 ML: 400.0000 mL | INJECTION | Status: DC

## 2015-09-03 MED ORDER — BUPIVACAINE 0.25 % ON-Q PUMP DUAL CATH 400 ML
INJECTION | Status: AC
  Filled 2015-09-03: qty 400

## 2015-09-03 MED ORDER — SIMETHICONE 80 MG PO CHEW
80.0000 mg | CHEWABLE_TABLET | ORAL | Status: DC | PRN
  Filled 2015-09-03: qty 1

## 2015-09-03 MED ORDER — IBUPROFEN 600 MG PO TABS
600.0000 mg | ORAL_TABLET | Freq: Four times a day (QID) | ORAL | Status: DC
  Administered 2015-09-03 – 2015-09-06 (×10): 600 mg via ORAL
  Filled 2015-09-03 (×10): qty 1

## 2015-09-03 MED ORDER — WITCH HAZEL-GLYCERIN EX PADS: 1.0000 "application " | MEDICATED_PAD | CUTANEOUS | Status: DC | PRN

## 2015-09-03 MED ORDER — LACTATED RINGERS IV SOLN
INTRAVENOUS | Status: DC
Start: 2015-09-03 — End: 2015-09-03
  Administered 2015-09-03: 09:00:00 via INTRAVENOUS

## 2015-09-03 MED ORDER — NALBUPHINE HCL 10 MG/ML IJ SOLN: 5.0000 mg | INTRAMUSCULAR | Status: DC | PRN

## 2015-09-03 MED ORDER — LEVOTHYROXINE SODIUM 50 MCG PO TABS
50.0000 ug | ORAL_TABLET | Freq: Every day | ORAL | Status: DC
  Administered 2015-09-04 – 2015-09-06 (×3): 50 ug via ORAL
  Filled 2015-09-03 (×4): qty 1

## 2015-09-03 MED ORDER — DIPHENHYDRAMINE HCL 25 MG PO CAPS: 25.0000 mg | ORAL_CAPSULE | Freq: Four times a day (QID) | ORAL | Status: DC | PRN

## 2015-09-03 MED ORDER — ONDANSETRON HCL 4 MG/2ML IJ SOLN: 4.0000 mg | Freq: Three times a day (TID) | INTRAMUSCULAR | Status: DC | PRN

## 2015-09-03 MED ORDER — NALOXONE HCL 0.4 MG/ML IJ SOLN: 0.4000 mg | INTRAMUSCULAR | Status: DC | PRN

## 2015-09-03 SURGICAL SUPPLY — 30 items
BAG COUNTER SPONGE EZ (MISCELLANEOUS) ×2 IMPLANT
CANISTER SUCT 3000ML (MISCELLANEOUS) ×3 IMPLANT
CATH KIT ON-Q SILVERSOAK 5IN (CATHETERS) ×6 IMPLANT
CHLORAPREP W/TINT 26ML (MISCELLANEOUS) ×6 IMPLANT
CLOSURE WOUND 1/2 X4 (GAUZE/BANDAGES/DRESSINGS) ×1
COUNTER SPONGE BAG EZ (MISCELLANEOUS) ×1
DRSG TELFA 3X8 NADH (GAUZE/BANDAGES/DRESSINGS) ×3 IMPLANT
ELECT CAUTERY BLADE 6.4 (BLADE) IMPLANT
ELECT REM PT RETURN 9FT ADLT (ELECTROSURGICAL) ×3
ELECTRODE REM PT RTRN 9FT ADLT (ELECTROSURGICAL) ×1 IMPLANT
EXTENDER TRAXI PANNICULUS (MISCELLANEOUS) ×1 IMPLANT
GAUZE SPONGE 4X4 12PLY STRL (GAUZE/BANDAGES/DRESSINGS) ×3 IMPLANT
GLOVE BIO SURGEON STRL SZ7 (GLOVE) ×15 IMPLANT
GLOVE INDICATOR 7.5 STRL GRN (GLOVE) ×15 IMPLANT
GOWN STRL REUS W/ TWL LRG LVL3 (GOWN DISPOSABLE) ×3 IMPLANT
GOWN STRL REUS W/TWL LRG LVL3 (GOWN DISPOSABLE) ×6
LIQUID BAND (GAUZE/BANDAGES/DRESSINGS) ×3 IMPLANT
NS IRRIG 1000ML POUR BTL (IV SOLUTION) ×3 IMPLANT
PACK C SECTION AR (MISCELLANEOUS) ×3 IMPLANT
PAD OB MATERNITY 4.3X12.25 (PERSONAL CARE ITEMS) ×3 IMPLANT
PAD PREP 24X41 OB/GYN DISP (PERSONAL CARE ITEMS) ×3 IMPLANT
RETRACTOR WND ALEXIS-O 25 LRG (MISCELLANEOUS) ×1 IMPLANT
RTRCTR WOUND ALEXIS O 25CM LRG (MISCELLANEOUS) ×3
STRIP CLOSURE SKIN 1/2X4 (GAUZE/BANDAGES/DRESSINGS) ×2 IMPLANT
SUT MNCRL AB 4-0 PS2 18 (SUTURE) ×3 IMPLANT
SUT PDS AB 1 TP1 96 (SUTURE) ×6 IMPLANT
SUT VIC AB 0 CTX 36 (SUTURE) ×4
SUT VIC AB 0 CTX36XBRD ANBCTRL (SUTURE) ×2 IMPLANT
SUT VIC AB 2-0 CT1 36 (SUTURE) IMPLANT
TRAXI PANNICULUS EXTENDER (MISCELLANEOUS) ×2

## 2015-09-03 NOTE — Transfer of Care (Signed)
Immediate Anesthesia Transfer of Care Note  Patient: Shirley Hayes  Procedure(s) Performed: Procedure(s): CESAREAN SECTION (N/A)  Patient Location: OB PACU  Anesthesia Type:Spinal  Level of Consciousness: awake, alert  and oriented  Airway & Oxygen Therapy: Patient Spontanous Breathing  Post-op Assessment: Report given to RN and Post -op Vital signs reviewed and stable  Post vital signs: Reviewed and stable  Last Vitals:  Filed Vitals:   09/03/15 0836 09/03/15 0837  BP:  138/92  Pulse:  116  Temp: 36.7 C   Resp: 20     Last Pain: There were no vitals filed for this visit.       Complications: No apparent anesthesia complications

## 2015-09-03 NOTE — Anesthesia Preprocedure Evaluation (Signed)
Anesthesia Evaluation  Patient identified by MRN, date of birth, ID band Patient awake    Reviewed: Allergy & Precautions, NPO status , Patient's Chart, lab work & pertinent test results, reviewed documented beta blocker date and time   Airway Mallampati: II  TM Distance: >3 FB     Dental  (+) Chipped   Pulmonary asthma , former smoker,           Cardiovascular      Neuro/Psych PSYCHIATRIC DISORDERS Anxiety Depression    GI/Hepatic GERD  Controlled,  Endo/Other  diabetes, Type 2Hypothyroidism Morbid obesity  Renal/GU      Musculoskeletal   Abdominal   Peds  Hematology   Anesthesia Other Findings   Reproductive/Obstetrics                             Anesthesia Physical Anesthesia Plan  ASA: III  Anesthesia Plan: Spinal   Post-op Pain Management:    Induction:   Airway Management Planned:   Additional Equipment:   Intra-op Plan:   Post-operative Plan:   Informed Consent: I have reviewed the patients History and Physical, chart, labs and discussed the procedure including the risks, benefits and alternatives for the proposed anesthesia with the patient or authorized representative who has indicated his/her understanding and acceptance.     Plan Discussed with: CRNA  Anesthesia Plan Comments:         Anesthesia Quick Evaluation

## 2015-09-03 NOTE — Discharge Summary (Signed)
Obstetric Discharge Summary Reason for Admission: cesarean section Prenatal Procedures: none Intrapartum Procedures: cesarean: low cervical, transverse Postpartum Procedures: none Complications-Operative and Postpartum: none HEMOGLOBIN  Date Value Ref Range Status  09/02/2015 12.0 12.0 - 16.0 g/dL Final   HGB  Date Value Ref Range Status  09/17/2013 11.6* 12.0-16.0 g/dL Final   HCT  Date Value Ref Range Status  09/02/2015 34.8* 35.0 - 47.0 % Final  09/19/2013 28.9* 35.0-47.0 % Final   Post-op Hct: 30.0  Physical Exam:  General: alert, appears stated age and no distress Lochia: appropriate Uterine Fundus: firm Incision: healing well DVT Evaluation: No evidence of DVT seen on physical exam.  Discharge Diagnoses: Term Pregnancy-delivered  Discharge Information: Date: 09/03/2015 Activity: pelvic rest and no lifting greater than 10lbs x 6 weeks, and no driving for the first 2 weeks Diet: routine   Medication List    STOP taking these medications        ranitidine 150 MG tablet  Commonly known as:  ZANTAC      TAKE these medications        FLUoxetine 20 MG tablet  Commonly known as:  PROZAC  Take 20 mg by mouth daily.     ibuprofen 600 MG tablet  Commonly known as:  ADVIL,MOTRIN  Take 1 tablet (600 mg total) by mouth every 6 (six) hours.     levothyroxine 50 MCG tablet  Commonly known as:  SYNTHROID, LEVOTHROID  Take 50 mcg by mouth daily before breakfast. Reported on 09/03/2015     oxyCODONE-acetaminophen 5-325 MG tablet  Commonly known as:  PERCOCET/ROXICET  Take 1-2 tablets by mouth every 4 (four) hours as needed (pain scale 4-7).     prenatal multivitamin Tabs tablet  Take 1 tablet by mouth daily at 12 noon.         Condition: stable Discharge to: home Follow-up Information    Follow up with Lorrene Reid, MD In 1 week.   Specialty:  Obstetrics and Gynecology   Why:  For wound re-check   Contact information:   [address] [place] [address] [phone]       Newborn Data: Live born female  Birth Weight: 8 lb 13.1 oz (4000 g) APGAR: 9, 9  Home with mother.  STAEBLER, ANDREAS M 09/03/2015, 4:09 PM    Updated 09/06/2015 8:33 AM Robertha Staples, Delaney Meigs, CNM

## 2015-09-03 NOTE — H&P (Signed)
Date of Initial paper H&P: 09/02/2015  History reviewed, patient examined, no change in status, stable for surgery.

## 2015-09-03 NOTE — Anesthesia Procedure Notes (Signed)
Spinal Patient location during procedure: OR Staffing Anesthesiologist: Berdine Addison Performed by: anesthesiologist  Preanesthetic Checklist Completed: patient identified, site marked, surgical consent, pre-op evaluation, timeout performed, IV checked and risks and benefits discussed Spinal Block Patient position: sitting Prep: Betadine Patient monitoring: heart rate, cardiac monitor, continuous pulse ox and blood pressure Approach: midline Location: L3-4 Injection technique: single-shot Needle Needle type: Pencil-Tip  Needle gauge: 25 G Needle length: 9 cm Assessment Sensory level: T10 Additional Notes Marcaine 1.46ml and astro 0.2mg .

## 2015-09-03 NOTE — Progress Notes (Signed)
FSBS 81. Dr Maisie Fus notified of patient status and ready for discharge from PACU and transfer to mother baby, order received to transfer

## 2015-09-03 NOTE — Lactation Note (Signed)
This note was copied from a baby's chart. Lactation Consultation Note  Patient Name: Shirley Hayes Date: 09/03/2015     Maternal Data   This is mother's first time breast feeding. Feeding Feeding Type: Breast Fed Length of feed: 80 min  LATCH Score/Interventions                      Lactation Tools Discussed/Used     Consult Status      Trudee Grip 09/03/2015, 5:27 PM

## 2015-09-03 NOTE — Op Note (Signed)
Preoperative Diagnosis: 1) 20 y.o. G3P1001 at [redacted]w[redacted]d 2) History of prior cesarean section desiring repeat 3) Morbid obesity Body mass index is 45.8 kg/(m^2).  Postoperative Diagnosis: 1) 19 y.o. G3P1001 at [redacted]w[redacted]d 2) History of prior cesarean section desiring repeat 3) Morbid obesity Body mass index is 45.8 kg/(m^2).  Operation Performed: Repeat low transverse C-section via pfannenstiel skin incision  Indiciation: Prior cesarean section  Anesthesia: Spinal  Primary Surgeon: Vena Austria, MD   Assistant: Ranae Plumber, MD  Preoperative Antibiotics: 3g ancef  Estimated Blood Loss:  IV Fluids:  Urine Output::  Drains or Tubes: Foley to gravity drainage, ON-Q catheter system  Implants: none  Specimens Removed: none  Complications: none  Intraoperative Findings:  Normal tubes ovaries and uterus.  Delivery resulted in the birth of a liveborn female APGAR (1 MIN): 9   APGAR (5 MINS): 9, weight 8lbs 13oz  Patient Condition:stable  Procedure in Detail:  Patient was taken to the operating room were she was administered regional anesthesia.  She was positioned in the supine position, prepped and draped in the  Usual sterile fashion.  Prior to proceeding with the case a time out was performed and the level of anesthetic was checked and noted to be adequate.  Utilizing the scalpel a pfannenstiel skin incision was made 2cm above the pubic symphysis utilizing the patient's pre-existing scar and carried down sharply to the the level of the rectus fascia.  The fascia was incised in the midline using the scalpel and then extended using mayo scissors.  The superior border of the rectus fascia was grasped with two Kocher clamps and the underlying rectus muscles were dissected of the fascia using blunt dissection.  The median raphae was incised using Mayo scissors.   The inferior border of the rectus fascia was dissected of the rectus muscles in a similar fashion.  The midline  was identified, the peritoneum was entered bluntly and expanded using manual tractions.  The uterus was noted to be in a none rotated position.  Next the bladder blade was placed retracting the bladder caudally.  A bladder flap was not created.  A low transverse incision was scored on the lower uterine segment.  The hysterotomy was entered bluntly using the operators finger.  The hysterotomy incision was extended using manual traction.  The operators hand was placed within the hysterotomy position noting the fetus to be within the OA position.  The vertex was grasped, flexed, brought to the incision, and delivered a traumatically using fundal pressure.  The remainder of the body delivered with ease.  The infant was suctioned, cord was clamped and cut before handing off to the awaiting neonatologist.  The placenta was delivered using manual extraction.  The uterus was exteriorized, wiped clean of clots and debris using two moist laps.  The hysterotomy was closed using a two layer closure of 0 Vicryl, with the first being a running locked, the second a vertical imbricating.  The uterus was returned to the abdomen.  The peritoneal gutters were wiped clean of clots and debris using two moist laps.  The hysterotomy incision was re-inspected noted to be hemostatic.  The rectus muscles were re-approximated in the midline using a single 2-0 Vicryl mattress stitch.  The rectus muscles were inspected noted to be hemostatic.  The superior border of the rectus fascia was grasped with a Kocher clamp.  The ON-Q trocars were then placed 4cm above the superior border of the incision and tunneled subfascially.  The introducers  were removed and the catheters were threaded through the sleeves after which the sleeves were removed.  The fascia was closed using a looped #1 PDS in a running fashion taking 1cm by 1cm bites.  The subcutaneous tissue was irrigated using warm saline, hemostasis achieved using the bovis.  The subcutaneous  dead space was greater 3cm and was closed.  The subcutaneous dead space was obliterated by using a 53-T 0 Chromic in a running fashion.   The skin was closed using 4-0 Monocryl in a subcuticular fashion.  Sponge needle and instrument counts were corrects times two.  The patient tolerated the procedure well and was taken to the recovery room in stable condition.

## 2015-09-04 LAB — CBC
HCT: 30 % — ABNORMAL LOW (ref 35.0–47.0)
HEMOGLOBIN: 10.5 g/dL — AB (ref 12.0–16.0)
MCH: 31.6 pg (ref 26.0–34.0)
MCHC: 34.9 g/dL (ref 32.0–36.0)
MCV: 90.4 fL (ref 80.0–100.0)
PLATELETS: 176 10*3/uL (ref 150–440)
RBC: 3.31 MIL/uL — AB (ref 3.80–5.20)
RDW: 15.5 % — ABNORMAL HIGH (ref 11.5–14.5)
WBC: 7.4 10*3/uL (ref 3.6–11.0)

## 2015-09-04 MED ORDER — FAMOTIDINE 20 MG PO TABS
20.0000 mg | ORAL_TABLET | Freq: Two times a day (BID) | ORAL | Status: DC
  Administered 2015-09-04 – 2015-09-06 (×4): 20 mg via ORAL
  Filled 2015-09-04 (×5): qty 1

## 2015-09-04 NOTE — Anesthesia Post-op Follow-up Note (Signed)
  Anesthesia Pain Follow-up Note  Patient: Shirley Hayes  Day #: 1  Date of Follow-up: 09/04/2015 Time: 7:10 AM  Last Vitals:  Filed Vitals:   09/03/15 1938 09/04/15 0008  BP: 115/51 118/77  Pulse: 101 102  Temp: 36.5 C 37.5 C  Resp: 20 20    Level of Consciousness: alert  Pain: mild   Side Effects:None  Catheter Site Exam:clean, dry     Plan: D/C from anesthesia care  Rica Mast

## 2015-09-04 NOTE — Progress Notes (Signed)
Patient ID: NECO VISCUSI, female   DOB: 04/16/1995, 20 y.o.   MRN: [id number] Obstetric Postpartum/PostOperative Daily Progress Note Subjective:  20 y.o. [id number] status post repeat cesarean delivery.  She is ambulating, is tolerating po, is voiding spontaneously.  Her pain is well controlled on PO pain medications. Her lochia is equal to menses.   Medications SCHEDULED MEDICATIONS  . bupivacaine  10 mL Infiltration Once  . ibuprofen  600 mg Oral Q6H  . levothyroxine  50 mcg Oral QAC breakfast  . prenatal multivitamin  1 tablet Oral Q1200  . scopolamine  1 patch Transdermal Once  . senna-docusate  2 tablet Oral Q24H  . simethicone  80 mg Oral TID PC  . simethicone  80 mg Oral Q24H    MEDICATION INFUSIONS  . bupivacaine 0.25 % ON-Q pump DUAL CATH 400 mL    . lactated ringers 125 mL/hr at 09/03/15 2309  . naLOXone [facility]) adult infusion for PRURITIS    . oxytocin      PRN MEDICATIONS  acetaminophen, coconut oil, witch hazel-glycerin **AND** dibucaine, diphenhydrAMINE, fentaNYL (SUBLIMAZE) injection, menthol-cetylpyridinium, meperidine (DEMEROL) injection, nalbuphine **OR** nalbuphine, nalbuphine **OR** nalbuphine, naLOXone (NARCAN) adult infusion for PRURITIS, naloxone **AND** sodium chloride flush, ondansetron (ZOFRAN) IV, oxyCODONE-acetaminophen, oxyCODONE-acetaminophen, simethicone    Objective:   Filed Vitals:   09/03/15 1652 09/03/15 1938 09/04/15 0008 09/04/15 0809  BP: 114/66 115/51 118/77 110/61  Pulse: 97 101 102 73  Temp: 98 F (36.7 C) 97.7 F (36.5 C) 99.5 F (37.5 C) 98.9 F (37.2 C)  TempSrc: Axillary Oral Oral Oral  Resp: 18 20 20 18   Height:      Weight:      SpO2: 98% 98% 98%     Current Vital Signs 24h Vital Sign Ranges  T 98.9 F (37.2 C) Temp  Avg: 98.5 F (36.9 C)  Min: 97.7 F (36.5 C)  Max: 99.5 F (37.5 C)  BP 110/61 mmHg BP  Min: 110/61  Max: 127/69  HR 73 Pulse  Avg: 93.7  Min: 73  Max: 102  RR 18 Resp  Avg: 19.2  Min: 18  Max: 20   SaO2 98 % Not Delivered SpO2  Avg: 98.2 %  Min: 98 %  Max: 99 %       24 Hour I/O Current Shift I/O  Time Ins Outs 07/13 0701 - 07/14 0700 In: 2287.5 [I.V.:2287.5] Out: 3025 [Urine:2425] 07/14 0701 - 07/14 1900 In: 942.4 [I.V.:942.4] Out: 450 [Urine:450]  General: NAD Pulmonary: no increased work of breathing Abdomen: non-distended, non-tender, fundus firm at level of umbilicus Inc: bandage in place, appears dry Extremities: no edema, no erythema, no tenderness  Labs:   Recent Labs Lab 08/29/15 1056 09/02/15 1250 09/04/15 0622  WBC 5.8 6.6 7.4  HGB 12.2 12.0 10.5*  HCT 34.5* 34.8* 30.0*  PLT 182 188 176     Assessment:   19 y.o. [id number] postoperative day # 1, s/p repeat cesarean section  Plan:  1) Acute blood loss anemia - hemodynamically stable and asymptomatic - po ferrous sulfate  2) O NEG / Rubella Nonimmune (06/08 0000)  3) breast feeding   5) Disposition: home POD 2-3  Conard Novak, MD 09/04/2015 2:11 PM

## 2015-09-04 NOTE — Anesthesia Postprocedure Evaluation (Signed)
Anesthesia Post Note  Patient: Shirley Hayes  Procedure(s) Performed: Procedure(s) (LRB): CESAREAN SECTION (N/A)  Patient location during evaluation: Mother Baby Anesthesia Type: Spinal Level of consciousness: awake and alert and oriented Pain management: pain level controlled Vital Signs Assessment: post-procedure vital signs reviewed and stable Respiratory status: spontaneous breathing Cardiovascular status: stable Postop Assessment: no headache Anesthetic complications: no    Last Vitals:  Filed Vitals:   09/03/15 1938 09/04/15 0008  BP: 115/51 118/77  Pulse: 101 102  Temp: 36.5 C 37.5 C  Resp: 20 20    Last Pain:  Filed Vitals:   09/04/15 0007  PainSc: 0-No pain                 Rahmir Beever,  Alessandra Bevels

## 2015-09-05 MED ORDER — MEASLES, MUMPS & RUBELLA VAC ~~LOC~~ INJ
0.5000 mL | INJECTION | Freq: Once | SUBCUTANEOUS | Status: AC
  Administered 2015-09-06: 0.5 mL via SUBCUTANEOUS
  Filled 2015-09-05 (×2): qty 0.5

## 2015-09-05 MED ORDER — VARICELLA VIRUS VACCINE LIVE 1350 PFU/0.5ML IJ SUSR
0.5000 mL | INTRAMUSCULAR | Status: AC | PRN
  Administered 2015-09-06: 0.5 mL via SUBCUTANEOUS
  Filled 2015-09-05: qty 0.5

## 2015-09-05 NOTE — Progress Notes (Signed)
  Post-operative Day 2  Subjective: no complaints, up ad lib, voiding, tolerating PO and + flatus  Objective: Blood pressure 122/60, pulse 68, temperature 98.1 F (36.7 C), temperature source Axillary, resp. rate 18, height 5' 7.5" (1.715 m), weight 297 lb (134.718 kg), last menstrual period 11/28/2014, SpO2 99 %  Physical Exam:  General: alert and cooperative Lochia: appropriate Uterine Fundus: firm Incision: healing well DVT Evaluation: No evidence of DVT seen on physical exam. Abdomen: soft, NT   Recent Labs  09/02/15 1250 09/04/15 0622  HGB 12.0 10.5*  HCT 34.8* 30.0*    Assessment POD #2  Plan: Infant needing to stay d/t 10% weight loss Discharge tomorrow and Continue PO care  Feeding: breast Contraception: IUD Blood Type: O neg (baby O neg)  RNI/VNI - both ordered prior to discharge TDAP UTD    Marta Antu, CNM 09/05/2015, 11:54 AM

## 2015-09-06 MED ORDER — OXYCODONE-ACETAMINOPHEN 5-325 MG PO TABS: 1.0000 | ORAL_TABLET | ORAL | Status: DC | PRN

## 2015-09-06 MED ORDER — PRENATAL MULTIVITAMIN CH: 1.0000 | ORAL_TABLET | Freq: Every day | ORAL | Status: DC

## 2015-09-06 MED ORDER — IBUPROFEN 600 MG PO TABS: 600.0000 mg | ORAL_TABLET | Freq: Four times a day (QID) | ORAL | Status: DC

## 2015-09-06 NOTE — Discharge Instructions (Signed)
Discharge instructions:  ° °Call office if you have any of the following: headache, visual changes, fever >100 F, chills, breast concerns, excessive vaginal bleeding, incision drainage or problems, leg pain or redness, depression or any other concerns.  ° °Activity: Do not lift > 10 lbs for 6 weeks.  °No intercourse or tampons for 6 weeks.  °No driving for 1-2 weeks.  ° °Call your doctor for increased pain or vaginal bleeding, temperature above 100.4, depression, or concerns.  No strenuous activity or heavy lifting for 6 weeks.  No intercourse, tampons, douching, or enemas for 6 weeks.  No tub baths-showers only.  No driving for 2 weeks or while taking pain medications.  Continue prenatal vitamin and iron.  Keep incision clean and dry.  Call your doctor for incision concerns including redness, swelling, bleeding or drainage, or if begins to come apart.  Increase calories and fluids while breastfeeding. °

## 2015-09-06 NOTE — Progress Notes (Signed)
On Q pump removed per pt request.  Pt tolerated well.  Bandaid applied over site. Reynold Bowen, RN 09/06/2015 6:01 PM

## 2015-09-06 NOTE — Progress Notes (Signed)
Pt states that she received TDaP vaccine during pregnancy.  Pt declines TDaP vaccine at this time. Reynold Bowen, RN 09/06/2015 2:25 PM

## 2015-09-06 NOTE — Progress Notes (Addendum)
Discharge instructions provided.  Pt and sig other verbalize understanding of all instructions and follow-up care.  Prescriptions given. Pt discharged to home with infant at 1515 on 09/06/15 via wheelchair by RN. Reynold Bowen, RN 09/06/2015 5:59 PM

## 2015-09-11 LAB — GLUCOSE, CAPILLARY: GLUCOSE-CAPILLARY: 81 mg/dL (ref 65–99)

## 2017-04-05 ENCOUNTER — Encounter: Payer: Self-pay | Admitting: Emergency Medicine

## 2017-04-05 ENCOUNTER — Emergency Department
Admission: EM | Admit: 2017-04-05 | Discharge: 2017-04-05 | Disposition: A | Discharge status: Home / Self Care | Payer: Medicaid Other | Attending: Emergency Medicine | Admitting: Emergency Medicine

## 2017-04-05 DIAGNOSIS — E119 Type 2 diabetes mellitus without complications: Secondary | ICD-10-CM | POA: Diagnosis not present

## 2017-04-05 DIAGNOSIS — E039 Hypothyroidism, unspecified: Secondary | ICD-10-CM | POA: Diagnosis not present

## 2017-04-05 DIAGNOSIS — Z79899 Other long term (current) drug therapy: Secondary | ICD-10-CM | POA: Diagnosis not present

## 2017-04-05 DIAGNOSIS — J45909 Unspecified asthma, uncomplicated: Secondary | ICD-10-CM | POA: Insufficient documentation

## 2017-04-05 DIAGNOSIS — J020 Streptococcal pharyngitis: Secondary | ICD-10-CM | POA: Insufficient documentation

## 2017-04-05 DIAGNOSIS — Z87891 Personal history of nicotine dependence: Secondary | ICD-10-CM | POA: Diagnosis not present

## 2017-04-05 DIAGNOSIS — J029 Acute pharyngitis, unspecified: Secondary | ICD-10-CM | POA: Diagnosis present

## 2017-04-05 LAB — GROUP A STREP BY PCR: Group A Strep by PCR: DETECTED — AB

## 2017-04-05 MED ORDER — PREDNISONE 10 MG PO TABS: 30.0000 mg | ORAL_TABLET | Freq: Every day | ORAL | 0 refills | Status: DC

## 2017-04-05 MED ORDER — AMOXICILLIN 875 MG PO TABS: 875.0000 mg | ORAL_TABLET | Freq: Two times a day (BID) | ORAL | 0 refills | Status: DC

## 2017-04-05 NOTE — ED Provider Notes (Signed)
[facility] Emergency Department Provider Note  ____________________________________________   First MD Initiated Contact with Patient 04/05/17 1312     (approximate)  I have reviewed the triage vital signs and the nursing notes.   HISTORY  Chief Complaint Sore Throat    HPI Shirley Hayes is a 22 y.o. female presents to the emergency room complaining of sore throat for 2 days.  She states she noticed that she has white patches on her tonsils.  She complains of pain with swallowing.  She states she does not know that she has had a fever.  She denies chest pain or shortness of breath.  Cough or congestion.  Past Medical History:  Diagnosis Date  . Anxiety   . Asthma    physical induced asthma  . Depression   . Diabetes mellitus without complication (HCC)   . GERD (gastroesophageal reflux disease)   . Gestational diabetes   . Hypothyroidism   . Mental disorder     Patient Active Problem List   Diagnosis Date Noted  . S/P cesarean section 09/03/2015  . Labor and delivery, indication for care 08/29/2015    Past Surgical History:  Procedure Laterality Date  . CESAREAN SECTION  [id number]  . CESAREAN SECTION N/A 09/03/2015   Procedure: CESAREAN SECTION;  Surgeon: Vena Austria, MD;  Location: [facility] ORS;  Service: Obstetrics;  Laterality: N/A;  . CHOLECYSTECTOMY N/A 01/01/2015   Procedure: LAPAROSCOPIC CHOLECYSTECTOMY WITH INTRAOPERATIVE CHOLANGIOGRAM, laparoscopic incisional hernia repair;  Surgeon: Kieth Brightly, MD;  Location: [facility] ORS;  Service: General;  Laterality: N/A;  . LAPAROSCOPY  02/22/2012    Prior to Admission medications   Medication Sig Start Date End Date Taking? Authorizing Provider  amoxicillin (AMOXIL) 875 MG tablet Take 1 tablet (875 mg total) by mouth 2 (two) times daily. 04/05/17   Fisher, Roselyn Bering, PA-C  FLUoxetine (PROZAC) 20 MG tablet Take 20 mg by mouth daily.    [provider]  ibuprofen  (ADVIL,MOTRIN) 600 MG tablet Take 1 tablet (600 mg total) by mouth every 6 (six) hours. 09/06/15   Marta Antu, CNM  levothyroxine (SYNTHROID, LEVOTHROID) 50 MCG tablet Take 50 mcg by mouth daily before breakfast. Reported on 09/03/2015    [provider]  oxyCODONE-acetaminophen (PERCOCET/ROXICET) 5-325 MG tablet Take 1-2 tablets by mouth every 4 (four) hours as needed (pain scale 4-7). 09/06/15   Marta Antu, CNM  predniSONE (DELTASONE) 10 MG tablet Take 3 tablets (30 mg total) by mouth daily with breakfast. 04/05/17   Sherrie Mustache Roselyn Bering, PA-C  Prenatal Vit-Fe Fumarate-FA (PRENATAL MULTIVITAMIN) TABS tablet Take 1 tablet by mouth daily at 12 noon. 09/06/15   Marta Antu, CNM    Allergies Chlorhexidine gluconate  Family History  Problem Relation Age of Onset  . Hypertension Unknown     Social History Social History   Tobacco Use  . Smoking status: Former Smoker    Packs/day: 1.00    Years: 3.00    Pack years: 3.00    Types: Cigarettes    Last attempt to quit: 07/22/2013    Years since quitting: 3.7  . Smokeless tobacco: Never Used  Substance Use Topics  . Alcohol use: No    Alcohol/week: 0.0 oz  . Drug use: No    Review of Systems  Constitutional: Unsure fever/chills Eyes: No visual changes. ENT: Positive sore throat. Respiratory: Denies cough Genitourinary: Negative for dysuria. Musculoskeletal: Negative for back pain. Skin: Negative for rash.    ____________________________________________   PHYSICAL EXAM:  VITAL SIGNS: ED Triage Vitals  Enc Vitals Group     BP 04/05/17 1240 (!) 148/93     Pulse Rate 04/05/17 1240 (!) 123     Resp 04/05/17 1240 18     Temp 04/05/17 1240 98 F (36.7 C)     Temp Source 04/05/17 1240 Oral     SpO2 04/05/17 1240 96 %     Weight 04/05/17 1241 (!) 320 lb (145.2 kg)     Height 04/05/17 1241 5' 6.5" (1.689 m)     Head Circumference --      Peak Flow --      Pain Score 04/05/17 1240 5     Pain Loc --      Pain  Edu? --      Excl. in GC? --     Constitutional: Alert and oriented. Well appearing and in no acute distress. Eyes: Conjunctivae are normal.  Head: Atraumatic. Ears: TMs are clear bilaterally Nose: No congestion/rhinnorhea. Mouth/Throat: Mucous membranes are moist.  Throat is red, tonsils are swollen with exudate bilaterally Neck: Is supple, no lymphadenopathy is noted, however the tonsillar glands are tender Cardiovascular: Normal rate, regular rhythm.  Heart sounds are normal Respiratory: Normal respiratory effort.  No retractions, lungs clear to auscultation GU: deferred Musculoskeletal: FROM all extremities, warm and well perfused Neurologic:  Normal speech and language.  Skin:  Skin is warm, dry and intact. No rash noted. Psychiatric: Mood and affect are normal. Speech and behavior are normal.  ____________________________________________   LABS (all labs ordered are listed, but only abnormal results are displayed)  Labs Reviewed  GROUP A STREP BY PCR - Abnormal; Notable for the following components:      Result Value   Group A Strep by PCR DETECTED (*)    All other components within normal limits   ____________________________________________   ____________________________________________  RADIOLOGY    ____________________________________________   PROCEDURES  Procedure(s) performed: No  Procedures    ____________________________________________   INITIAL IMPRESSION / ASSESSMENT AND PLAN / ED COURSE  Pertinent labs & imaging results that were available during my care of the patient were reviewed by me and considered in my medical decision making (see chart for details).  Patient is a 22 year old female complaining of sore throat with red tonsils and exudate.  On physical exam the throat is red tonsils are swollen and there is exudate posteriorly.  Strep test is positive.  Patient was given a prescription for amoxicillin.  She is worried that her throat  will continue to swell close up.  She is given a prescription for prednisone 30 mg daily for 3 days.  She is to take Tylenol and ibuprofen as needed for pain.  She is instructed to gargle with warm salt water.  She is to return if she is unable to swallow liquids.  Otherwise she can follow-up with her regular doctor if she is not improving in 3 days.  She was also instructed to discard her toothbrush in 3 days.  Patient states she understands comply with all of our instructions.  She was discharged in stable condition     As part of my medical decision making, I reviewed the following data within the electronic MEDICAL RECORD NUMBER Nursing notes reviewed and incorporated, Labs reviewed strep test is positive, Notes from prior ED visits and [place] Controlled Substance Database  ____________________________________________   FINAL CLINICAL IMPRESSION(S) / ED DIAGNOSES  Final diagnoses:  Acute streptococcal pharyngitis      NEW MEDICATIONS STARTED  DURING THIS VISIT:  Discharge Medication List as of 04/05/2017  1:38 PM    START taking these medications   Details  amoxicillin (AMOXIL) 875 MG tablet Take 1 tablet (875 mg total) by mouth 2 (two) times daily., Starting Wed 04/05/2017, Print    predniSONE (DELTASONE) 10 MG tablet Take 3 tablets (30 mg total) by mouth daily with breakfast., Starting Wed 04/05/2017, Print         Note:  This document was prepared using Dragon voice recognition software and may include unintentional dictation errors.    Faythe Ghee, PA-C 04/05/17 1625    Don Perking, [place], MD 04/06/17 [phone]

## 2017-04-05 NOTE — ED Triage Notes (Signed)
Pt comes into the ED via POV c/o sore throat x 2 days. Patient has visible white patches on the tonsils with inflammation.  Patient in NAD at this time and has no difficulty swallowing or breathing. Patient has been taking OTC tylenol for pain.

## 2017-04-05 NOTE — Discharge Instructions (Signed)
Follow-up with your regular doctor if you are not better in 3-5 days.  Gargle with warm salt water to help soothe the pain.  Discard your toothbrush in 3 days.  Take your medication as prescribed.  If you are worsening and cannot swallow liquids please return to the emergency department

## 2018-12-17 ENCOUNTER — Emergency Department: Payer: Medicaid Other

## 2018-12-17 ENCOUNTER — Emergency Department
Admission: EM | Admit: 2018-12-17 | Discharge: 2018-12-17 | Disposition: A | Discharge status: Home / Self Care | Payer: Medicaid Other | Attending: Student in an Organized Health Care Education/Training Program | Admitting: Student in an Organized Health Care Education/Training Program

## 2018-12-17 ENCOUNTER — Other Ambulatory Visit: Payer: Self-pay

## 2018-12-17 DIAGNOSIS — E039 Hypothyroidism, unspecified: Secondary | ICD-10-CM | POA: Diagnosis not present

## 2018-12-17 DIAGNOSIS — M79621 Pain in right upper arm: Secondary | ICD-10-CM | POA: Diagnosis not present

## 2018-12-17 DIAGNOSIS — E119 Type 2 diabetes mellitus without complications: Secondary | ICD-10-CM | POA: Insufficient documentation

## 2018-12-17 DIAGNOSIS — J45909 Unspecified asthma, uncomplicated: Secondary | ICD-10-CM | POA: Insufficient documentation

## 2018-12-17 DIAGNOSIS — Z79899 Other long term (current) drug therapy: Secondary | ICD-10-CM | POA: Diagnosis not present

## 2018-12-17 DIAGNOSIS — Z87891 Personal history of nicotine dependence: Secondary | ICD-10-CM | POA: Diagnosis not present

## 2018-12-17 DIAGNOSIS — L02411 Cutaneous abscess of right axilla: Secondary | ICD-10-CM | POA: Diagnosis present

## 2018-12-17 DIAGNOSIS — R590 Localized enlarged lymph nodes: Secondary | ICD-10-CM | POA: Insufficient documentation

## 2018-12-17 MED ORDER — PREDNISONE 10 MG (21) PO TBPK: ORAL_TABLET | ORAL | 0 refills | Status: DC

## 2018-12-17 MED ORDER — TRAMADOL HCL 50 MG PO TABS: 50.0000 mg | ORAL_TABLET | Freq: Four times a day (QID) | ORAL | 0 refills | Status: DC | PRN

## 2018-12-17 NOTE — Discharge Instructions (Addendum)
Follow-up with your primary care provider.  Take medications as prescribed.  You may find that heat or ice also gives you some pain relief.  Return to the ER for symptoms that change or worsen if unable to schedule an appointment.

## 2018-12-17 NOTE — ED Triage Notes (Signed)
Pt comes via POV from home with c/o abscess. Pt states abscess under her right arm. Pt states it has been there for over a week.   Pt states soreness and pain. Pt denies any rash or discharge. Pt states some swelling.

## 2018-12-17 NOTE — ED Provider Notes (Signed)
[facility] Emergency Department Provider Note  ____________________________________________  Time seen: Approximately 4:48 PM  I have reviewed the triage vital signs and the nursing notes.   HISTORY  Chief Complaint Abscess   HPI Shirley Hayes is a 23 y.o. female who presents to the emergency department for treatment and evaluation of tender area under the right arm.  She states it has been there for more than a week.  She states that it is worse when she walks and while lying down.  She has never had any previous skin abscesses or skin infections in the past.  She denies fever or any drainage from the area.    Past Medical History:  Diagnosis Date  . Anxiety   . Asthma    physical induced asthma  . Depression   . Diabetes mellitus without complication ([place])   . GERD (gastroesophageal reflux disease)   . Gestational diabetes   . Hypothyroidism   . Mental disorder     Patient Active Problem List   Diagnosis Date Noted  . S/P cesarean section 09/03/2015  . Labor and delivery, indication for care 08/29/2015    Past Surgical History:  Procedure Laterality Date  . CESAREAN SECTION  [id number]  . CESAREAN SECTION N/A 09/03/2015   Procedure: CESAREAN SECTION;  Surgeon: Malachy Mood, MD;  Location: [facility] ORS;  Service: Obstetrics;  Laterality: N/A;  . CHOLECYSTECTOMY N/A 01/01/2015   Procedure: LAPAROSCOPIC CHOLECYSTECTOMY WITH INTRAOPERATIVE CHOLANGIOGRAM, laparoscopic incisional hernia repair;  Surgeon: Christene Lye, MD;  Location: [facility] ORS;  Service: General;  Laterality: N/A;  . LAPAROSCOPY  02/22/2012    Prior to Admission medications   Medication Sig Start Date End Date Taking? Authorizing Provider  amoxicillin (AMOXIL) 875 MG tablet Take 1 tablet (875 mg total) by mouth 2 (two) times daily. 04/05/17   Fisher, Linden Dolin, PA-C  FLUoxetine (PROZAC) 20 MG tablet Take 20 mg by mouth daily.    [provider]  ibuprofen  (ADVIL,MOTRIN) 600 MG tablet Take 1 tablet (600 mg total) by mouth every 6 (six) hours. 09/06/15   Burlene Arnt, CNM  levothyroxine (SYNTHROID, LEVOTHROID) 50 MCG tablet Take 50 mcg by mouth daily before breakfast. Reported on 09/03/2015    [provider]  oxyCODONE-acetaminophen (PERCOCET/ROXICET) 5-325 MG tablet Take 1-2 tablets by mouth every 4 (four) hours as needed (pain scale 4-7). 09/06/15   Brothers, Jonelle Sidle, CNM  predniSONE (STERAPRED UNI-PAK 21 TAB) 10 MG (21) TBPK tablet Take 6 tablets on the first day and decrease by 1 tablet each day until finished. 12/17/18   Gladstone Rosas, Dessa Phi, FNP  Prenatal Vit-Fe Fumarate-FA (PRENATAL MULTIVITAMIN) TABS tablet Take 1 tablet by mouth daily at 12 noon. 09/06/15   Brothers, Jonelle Sidle, CNM  traMADol (ULTRAM) 50 MG tablet Take 1 tablet (50 mg total) by mouth every 6 (six) hours as needed. 12/17/18   Kailany Dinunzio, Johnette Abraham B, FNP    Allergies Chlorhexidine gluconate  Family History  Problem Relation Age of Onset  . Hypertension Other     Social History Social History   Tobacco Use  . Smoking status: Former Smoker    Packs/day: 1.00    Years: 3.00    Pack years: 3.00    Types: Cigarettes    Quit date: 07/22/2013    Years since quitting: 5.4  . Smokeless tobacco: Never Used  Substance Use Topics  . Alcohol use: No    Alcohol/week: 0.0 standard drinks  . Drug use: No    Review of Systems  Constitutional: Negative for fever. Respiratory: Negative for cough or shortness of breath.  Musculoskeletal: Negative for myalgias Skin: positive for tender area in right axilla Neurological: Negative for numbness or paresthesias. ____________________________________________   PHYSICAL EXAM:  VITAL SIGNS: ED Triage Vitals  Enc Vitals Group     BP 12/17/18 1608 (!) 158/99     Pulse Rate 12/17/18 1608 85     Resp 12/17/18 1608 18     Temp 12/17/18 1608 98.7 F (37.1 C)     Temp src --      SpO2 12/17/18 1608 99 %     Weight 12/17/18 1605 (!)  338 lb (153.3 kg)     Height 12/17/18 1605 5\' 7"  (1.702 m)     Head Circumference --      Peak Flow --      Pain Score 12/17/18 1605 5     Pain Loc --      Pain Edu? --      Excl. in GC? --      Constitutional: Well appearing. Eyes: Conjunctivae are clear without discharge or drainage. Nose: No rhinorrhea noted. Mouth/Throat: Airway is patent.  Neck: No stridor. Unrestricted range of motion observed. Cardiovascular: Capillary refill is <3 seconds.  Respiratory: Respirations are even and unlabored.. Musculoskeletal: Unrestricted range of motion observed. Neurologic: Awake, alert, and oriented x 4.  Skin: No obvious abscess or cellulitis noted in the area of tenderness just below the skin fold of the right axillary area.  Exam is limited by body habitus.  ____________________________________________   LABS (all labs ordered are listed, but only abnormal results are displayed)  Labs Reviewed - No data to display ____________________________________________  EKG  Not indicated. ____________________________________________  RADIOLOGY  Enlarged right axillary lymph node. ____________________________________________   PROCEDURES  Procedures ____________________________________________   INITIAL IMPRESSION / ASSESSMENT AND PLAN / ED COURSE  Shirley Hayes is a 23 y.o. female presenting to the emergency department for treatment and evaluation of tenderness under the right axilla.  Exam is overall reassuring.  The patient states that she has "googled her symptoms" and is worried that she either has a cyst, abscess, or breast cancer.  There is no evidence of any of the above, however I will get an ultrasound of the area to see if there is any type of fluid collection given the patient's body habitus limits the exam.   ----------------------------------------- 6:20 PM on 12/17/2018 -----------------------------------------  Ultrasound of the soft tissue in the  axilla is fairly reassuring.  Findings most consistent with reactive lymph node.  Patient will be placed on a tapered dose of prednisone and given some tramadol.  She is encouraged to schedule a follow-up appointment with her primary care provider and states that she has an appointment tomorrow.  For symptoms that change or worsen or for new concerns she is to return to the emergency department if she is unable to schedule an appointment with her primary care provider.  Medications - No data to display   Pertinent labs & imaging results that were available during my care of the patient were reviewed by me and considered in my medical decision making (see chart for details).  ____________________________________________   FINAL CLINICAL IMPRESSION(S) / ED DIAGNOSES  Final diagnoses:  Axillary pain, right  Lymphadenopathy, axillary    ED Discharge Orders         Ordered    predniSONE (STERAPRED UNI-PAK 21 TAB) 10 MG (21) TBPK tablet     12/17/18 1814  traMADol (ULTRAM) 50 MG tablet  Every 6 hours PRN     12/17/18 1814           Note:  This document was prepared using Dragon voice recognition software and may include unintentional dictation errors.   Chinita Pesterriplett, Jonavin Seder B, FNP 12/17/18 1821    Willy Eddyobinson, [place], MD 12/17/18 [phone]

## 2019-03-01 ENCOUNTER — Encounter: Payer: Self-pay | Admitting: Emergency Medicine

## 2019-03-01 ENCOUNTER — Emergency Department: Payer: Medicaid Other

## 2019-03-01 ENCOUNTER — Emergency Department
Admission: EM | Admit: 2019-03-01 | Discharge: 2019-03-01 | Disposition: A | Discharge status: Home / Self Care | Payer: Medicaid Other | Attending: Student in an Organized Health Care Education/Training Program | Admitting: Student in an Organized Health Care Education/Training Program

## 2019-03-01 ENCOUNTER — Other Ambulatory Visit: Payer: Self-pay

## 2019-03-01 DIAGNOSIS — R110 Nausea: Secondary | ICD-10-CM

## 2019-03-01 DIAGNOSIS — E039 Hypothyroidism, unspecified: Secondary | ICD-10-CM | POA: Diagnosis not present

## 2019-03-01 DIAGNOSIS — Z87891 Personal history of nicotine dependence: Secondary | ICD-10-CM | POA: Insufficient documentation

## 2019-03-01 DIAGNOSIS — R1084 Generalized abdominal pain: Secondary | ICD-10-CM

## 2019-03-01 DIAGNOSIS — E119 Type 2 diabetes mellitus without complications: Secondary | ICD-10-CM | POA: Diagnosis not present

## 2019-03-01 DIAGNOSIS — Z79899 Other long term (current) drug therapy: Secondary | ICD-10-CM | POA: Diagnosis not present

## 2019-03-01 DIAGNOSIS — R1030 Lower abdominal pain, unspecified: Secondary | ICD-10-CM | POA: Diagnosis present

## 2019-03-01 DIAGNOSIS — J45909 Unspecified asthma, uncomplicated: Secondary | ICD-10-CM | POA: Insufficient documentation

## 2019-03-01 LAB — CBC
HCT: 41.1 % (ref 36.0–46.0)
Hemoglobin: 13.8 g/dL (ref 12.0–15.0)
MCH: 29.1 pg (ref 26.0–34.0)
MCHC: 33.6 g/dL (ref 30.0–36.0)
MCV: 86.7 fL (ref 80.0–100.0)
Platelets: 187 10*3/uL (ref 150–400)
RBC: 4.74 MIL/uL (ref 3.87–5.11)
RDW: 12.8 % (ref 11.5–15.5)
WBC: 8.2 10*3/uL (ref 4.0–10.5)
nRBC: 0 % (ref 0.0–0.2)

## 2019-03-01 LAB — COMPREHENSIVE METABOLIC PANEL
ALT: 65 U/L — ABNORMAL HIGH (ref 0–44)
AST: 44 U/L — ABNORMAL HIGH (ref 15–41)
Albumin: 4.2 g/dL (ref 3.5–5.0)
Alkaline Phosphatase: 130 U/L — ABNORMAL HIGH (ref 38–126)
Anion gap: 9 (ref 5–15)
BUN: 15 mg/dL (ref 6–20)
CO2: 19 mmol/L — ABNORMAL LOW (ref 22–32)
Calcium: 9.2 mg/dL (ref 8.9–10.3)
Chloride: 107 mmol/L (ref 98–111)
Creatinine, Ser: 0.75 mg/dL (ref 0.44–1.00)
GFR calc Af Amer: >60 mL/min (ref >60)
GFR calc non Af Amer: >60 mL/min (ref >60)
Glucose, Bld: 114 mg/dL — ABNORMAL HIGH (ref 70–99)
Potassium: 4.1 mmol/L (ref 3.5–5.1)
Sodium: 135 mmol/L (ref 135–145)
Total Bilirubin: 0.8 mg/dL (ref 0.3–1.2)
Total Protein: 7.8 g/dL (ref 6.5–8.1)

## 2019-03-01 LAB — URINALYSIS, COMPLETE (UACMP) WITH MICROSCOPIC
Bilirubin Urine: NEGATIVE
Glucose, UA: NEGATIVE mg/dL
Hgb urine dipstick: NEGATIVE
Ketones, ur: NEGATIVE mg/dL
Leukocytes,Ua: NEGATIVE
Nitrite: NEGATIVE
Protein, ur: 100 mg/dL — AB
Specific Gravity, Urine: 1.032 — ABNORMAL HIGH (ref 1.005–1.030)
pH: 5 (ref 5.0–8.0)

## 2019-03-01 LAB — LIPASE, BLOOD: Lipase: 31 U/L (ref 11–51)

## 2019-03-01 LAB — PREGNANCY, URINE: Preg Test, Ur: NEGATIVE

## 2019-03-01 MED ORDER — SODIUM CHLORIDE 0.9 % IV BOLUS
500.0000 mL | Freq: Once | INTRAVENOUS | Status: AC
  Administered 2019-03-01: 500 mL via INTRAVENOUS

## 2019-03-01 MED ORDER — IOHEXOL 300 MG/ML  SOLN
125.0000 mL | Freq: Once | INTRAMUSCULAR | Status: AC | PRN
  Administered 2019-03-01: 125 mL via INTRAVENOUS
  Filled 2019-03-01: qty 125

## 2019-03-01 MED ORDER — MORPHINE SULFATE (PF) 4 MG/ML IV SOLN
4.0000 mg | INTRAVENOUS | Status: DC | PRN
  Administered 2019-03-01: 4 mg via INTRAVENOUS
  Filled 2019-03-01: qty 1

## 2019-03-01 MED ORDER — SODIUM CHLORIDE 0.9% FLUSH: 3.0000 mL | Freq: Once | INTRAVENOUS | Status: DC

## 2019-03-01 MED ORDER — ONDANSETRON HCL 4 MG/2ML IJ SOLN
4.0000 mg | Freq: Once | INTRAMUSCULAR | Status: AC
  Administered 2019-03-01: 4 mg via INTRAVENOUS
  Filled 2019-03-01: qty 2

## 2019-03-01 MED ORDER — PROMETHAZINE HCL 12.5 MG PO TABS: 12.5000 mg | ORAL_TABLET | Freq: Four times a day (QID) | ORAL | 0 refills | Status: DC | PRN

## 2019-03-01 NOTE — Discharge Instructions (Signed)

## 2019-03-01 NOTE — ED Provider Notes (Signed)
[facility] Emergency Department Provider Note    First MD Initiated Contact with Patient 03/01/19 1308     (approximate)  I have reviewed the triage vital signs and the nursing notes.   HISTORY  Chief Complaint Abdominal Pain and Nausea    HPI Shirley Hayes is a 24 y.o. female below listed history presents to the ER for less than 1day of moderate to severe lower abdominal pain without radiation.  States it was associated with nausea.  No measured fevers or chills.  No cough or congestion.  Denies any right upper quadrant tenderness.  Denies any vaginal discharge.  States that she is on OCP.   Has not been on any recent antibiotics.  No dysuria.  No history of kidney stones.  Denies any flank pain.   Past Medical History:  Diagnosis Date  . Anxiety   . Asthma    physical induced asthma  . Depression   . Diabetes mellitus without complication (HCC)   . GERD (gastroesophageal reflux disease)   . Gestational diabetes   . Hypothyroidism   . Mental disorder    Family History  Problem Relation Age of Onset  . Hypertension Other    Past Surgical History:  Procedure Laterality Date  . CESAREAN SECTION  [id number]  . CESAREAN SECTION N/A 09/03/2015   Procedure: CESAREAN SECTION;  Surgeon: Vena Austria, MD;  Location: [facility] ORS;  Service: Obstetrics;  Laterality: N/A;  . CHOLECYSTECTOMY N/A 01/01/2015   Procedure: LAPAROSCOPIC CHOLECYSTECTOMY WITH INTRAOPERATIVE CHOLANGIOGRAM, laparoscopic incisional hernia repair;  Surgeon: Kieth Brightly, MD;  Location: [facility] ORS;  Service: General;  Laterality: N/A;  . LAPAROSCOPY  02/22/2012   Patient Active Problem List   Diagnosis Date Noted  . S/P cesarean section 09/03/2015  . Labor and delivery, indication for care 08/29/2015      Prior to Admission medications   Medication Sig Start Date End Date Taking? Authorizing Provider  amoxicillin (AMOXIL) 875 MG tablet Take 1 tablet (875 mg total)  by mouth 2 (two) times daily. 04/05/17   Fisher, Roselyn Bering, PA-C  FLUoxetine (PROZAC) 20 MG tablet Take 20 mg by mouth daily.    [provider]  ibuprofen (ADVIL,MOTRIN) 600 MG tablet Take 1 tablet (600 mg total) by mouth every 6 (six) hours. 09/06/15   Marta Antu, CNM  levothyroxine (SYNTHROID, LEVOTHROID) 50 MCG tablet Take 50 mcg by mouth daily before breakfast. Reported on 09/03/2015    [provider]  oxyCODONE-acetaminophen (PERCOCET/ROXICET) 5-325 MG tablet Take 1-2 tablets by mouth every 4 (four) hours as needed (pain scale 4-7). 09/06/15   Brothers, Delaney Meigs, CNM  predniSONE (STERAPRED UNI-PAK 21 TAB) 10 MG (21) TBPK tablet Take 6 tablets on the first day and decrease by 1 tablet each day until finished. 12/17/18   Triplett, Kasandra Knudsen, FNP  Prenatal Vit-Fe Fumarate-FA (PRENATAL MULTIVITAMIN) TABS tablet Take 1 tablet by mouth daily at 12 noon. 09/06/15   Brothers, Delaney Meigs, CNM  promethazine (PHENERGAN) 12.5 MG tablet Take 1 tablet (12.5 mg total) by mouth every 6 (six) hours as needed. 03/01/19   Willy Eddy, MD  traMADol (ULTRAM) 50 MG tablet Take 1 tablet (50 mg total) by mouth every 6 (six) hours as needed. 12/17/18   Chinita Pester, FNP    Allergies Chlorhexidine gluconate    Social History Social History   Tobacco Use  . Smoking status: Former Smoker    Packs/day: 1.00    Years: 3.00    Pack years: 3.00  Types: Cigarettes    Quit date: 07/22/2013    Years since quitting: 5.6  . Smokeless tobacco: Never Used  Substance Use Topics  . Alcohol use: No    Alcohol/week: 0.0 standard drinks  . Drug use: No    Review of Systems Patient denies headaches, rhinorrhea, blurry vision, numbness, shortness of breath, chest pain, edema, cough, abdominal pain, nausea, vomiting, diarrhea, dysuria, fevers, rashes or hallucinations unless otherwise stated above in HPI. ____________________________________________   PHYSICAL EXAM:  VITAL SIGNS: Vitals:    03/01/19 1108  BP: (!) 148/93  Pulse: (!) 104  Resp: 18  Temp: 98.9 F (37.2 C)  SpO2: 99%    Constitutional: Alert and oriented.  Eyes: Conjunctivae are normal.  Head: Atraumatic. Nose: No congestion/rhinnorhea. Mouth/Throat: Mucous membranes are moist.   Neck: No stridor. Painless ROM.  Cardiovascular: mildly tachycardic, regular rhythm. Grossly normal heart sounds.  Good peripheral circulation. Respiratory: Normal respiratory effort.  No retractions. Lungs CTAB. Gastrointestinal: Soft, obese, no hernia noted. No abdominal bruits. No CVA tenderness.  Genitourinary: deferred Musculoskeletal: No lower extremity tenderness nor edema.  No joint effusions. Neurologic:  Normal speech and language. No gross focal neurologic deficits are appreciated. No facial droop Skin:  Skin is warm, dry and intact. No rash noted. Psychiatric: Mood and affect are normal. Speech and behavior are normal.  ____________________________________________   LABS (all labs ordered are listed, but only abnormal results are displayed)  Results for orders placed or performed during the hospital encounter of 03/01/19 (from the past 24 hour(s))  Lipase, blood     Status: None   Collection Time: 03/01/19 11:15 AM  Result Value Ref Range   Lipase 31 11 - 51 U/L  Comprehensive metabolic panel     Status: Abnormal   Collection Time: 03/01/19 11:15 AM  Result Value Ref Range   Sodium 135 135 - 145 mmol/L   Potassium 4.1 3.5 - 5.1 mmol/L   Chloride 107 98 - 111 mmol/L   CO2 19 (L) 22 - 32 mmol/L   Glucose, Bld 114 (H) 70 - 99 mg/dL   BUN 15 6 - 20 mg/dL   Creatinine, Ser 0.75 0.44 - 1.00 mg/dL   Calcium 9.2 8.9 - 10.3 mg/dL   Total Protein 7.8 6.5 - 8.1 g/dL   Albumin 4.2 3.5 - 5.0 g/dL   AST 44 (H) 15 - 41 U/L   ALT 65 (H) 0 - 44 U/L   Alkaline Phosphatase 130 (H) 38 - 126 U/L   Total Bilirubin 0.8 0.3 - 1.2 mg/dL   GFR calc non Af Amer >60 >60 mL/min   GFR calc Af Amer >60 >60 mL/min   Anion gap 9 5 -  15  CBC     Status: None   Collection Time: 03/01/19 11:15 AM  Result Value Ref Range   WBC 8.2 4.0 - 10.5 K/uL   RBC 4.74 3.87 - 5.11 MIL/uL   Hemoglobin 13.8 12.0 - 15.0 g/dL   HCT 41.1 36.0 - 46.0 %   MCV 86.7 80.0 - 100.0 fL   MCH 29.1 26.0 - 34.0 pg   MCHC 33.6 30.0 - 36.0 g/dL   RDW 12.8 11.5 - 15.5 %   Platelets 187 150 - 400 K/uL   nRBC 0.0 0.0 - 0.2 %  Urinalysis, Complete w Microscopic     Status: Abnormal   Collection Time: 03/01/19 11:15 AM  Result Value Ref Range   Color, Urine AMBER (A) YELLOW   APPearance HAZY (A) CLEAR  Specific Gravity, Urine 1.032 (H) 1.005 - 1.030   pH 5.0 5.0 - 8.0   Glucose, UA NEGATIVE NEGATIVE mg/dL   Hgb urine dipstick NEGATIVE NEGATIVE   Bilirubin Urine NEGATIVE NEGATIVE   Ketones, ur NEGATIVE NEGATIVE mg/dL   Protein, ur 810 (A) NEGATIVE mg/dL   Nitrite NEGATIVE NEGATIVE   Leukocytes,Ua NEGATIVE NEGATIVE   RBC / HPF 0-5 0 - 5 RBC/hpf   WBC, UA 0-5 0 - 5 WBC/hpf   Bacteria, UA FEW (A) NONE SEEN   Squamous Epithelial / LPF 0-5 0 - 5   Mucus PRESENT   Pregnancy, urine     Status: None   Collection Time: 03/01/19 11:15 AM  Result Value Ref Range   Preg Test, Ur NEGATIVE NEGATIVE   ____________________________________________ ____________________________________________  RADIOLOGY  I personally reviewed all radiographic images ordered to evaluate for the above acute complaints and reviewed radiology reports and findings.  These findings were personally discussed with the patient.  Please see medical record for radiology report.  ____________________________________________   PROCEDURES  Procedure(s) performed:  Procedures    Critical Care performed: no ____________________________________________   INITIAL IMPRESSION / ASSESSMENT AND PLAN / ED COURSE  Pertinent labs & imaging results that were available during my care of the patient were reviewed by me and considered in my medical decision making (see chart for  details).   DDX: appendicitis, toa, colitis, enteritis, ibd, constipation, stone, cystitis  Shirley Hayes is a 24 y.o. who presents to the ED with symptoms as described above.  Patient with moderate to severe abdominal pain.  No fever but is mildly tachycardic and describing pain that was worse in pregnancy.  She is denying any vaginal discharge to suggest PID or infectious process.  Her abdominal exam is somewhat limited due to her obesity.  She is status post cholecystectomy.  Based on her presentation I do feel that CT imaging indicated to evaluate for the above differential.  Will provide IV fluids as well as IV medication.   CT imaging is reassuring.  Repeat abdominal exam soft and benign.  She is well-appearing.  Discussed Covid testing which she is declined.  She is not having any dysuria or signs or symptoms of cystitis.  At this point I believe she stable appropriate for trial of outpatient management.  Discussed signs and symptoms for which she should return to the ER or seek medical attention.  Have discussed with the patient and available family all diagnostics and treatments performed thus far and all questions were answered to the best of my ability. The patient demonstrates understanding and agreement with plan.   The patient was evaluated in Emergency Department today for the symptoms described in the history of present illness. He/she was evaluated in the context of the global COVID-19 pandemic, which necessitated consideration that the patient might be at risk for infection with the SARS-CoV-2 virus that causes COVID-19. Institutional protocols and algorithms that pertain to the evaluation of patients at risk for COVID-19 are in a state of rapid change based on information released by regulatory bodies including the CDC and federal and state organizations. These policies and algorithms were followed during the patient's care in the ED.  As part of my medical decision making, I  reviewed the following data within the electronic MEDICAL RECORD NUMBER Nursing notes reviewed and incorporated, Labs reviewed, notes from prior ED visits and [place] Controlled Substance Database   ____________________________________________   FINAL CLINICAL IMPRESSION(S) / ED DIAGNOSES  Final diagnoses:  Generalized abdominal pain  Nausea      NEW MEDICATIONS STARTED DURING THIS VISIT:  New Prescriptions   PROMETHAZINE (PHENERGAN) 12.5 MG TABLET    Take 1 tablet (12.5 mg total) by mouth every 6 (six) hours as needed.     Note:  This document was prepared using Dragon voice recognition software and may include unintentional dictation errors.    Willy Eddy, MD 03/01/19 1539

## 2019-03-01 NOTE — ED Triage Notes (Signed)
Pt here with c/o lower abd pain and nausea that began this am, denies vomiting or diarrhea, states her periods are irregular and she is on birth control, denies pregnancy. Tried to change positions in the bed with no relief, states the pain is worse than labor, denies hx of kidney stones. NAD.

## 2019-03-01 NOTE — ED Notes (Signed)
Called CT and informed them that pt was ready

## 2019-03-25 ENCOUNTER — Other Ambulatory Visit: Payer: Self-pay | Admitting: Surgical Oncology

## 2019-03-25 DIAGNOSIS — K219 Gastro-esophageal reflux disease without esophagitis: Secondary | ICD-10-CM

## 2019-04-05 ENCOUNTER — Ambulatory Visit
Admission: RE | Admit: 2019-04-05 | Discharge: 2019-04-05 | Disposition: A | Discharge status: Home / Self Care | Payer: Medicaid Other | Source: Ambulatory Visit | Attending: Surgical Oncology | Admitting: Surgical Oncology

## 2019-04-05 DIAGNOSIS — K219 Gastro-esophageal reflux disease without esophagitis: Secondary | ICD-10-CM

## 2019-04-25 ENCOUNTER — Encounter: Payer: Self-pay | Admitting: Obstetrics and Gynecology

## 2019-04-25 ENCOUNTER — Other Ambulatory Visit: Payer: Self-pay

## 2019-04-25 ENCOUNTER — Ambulatory Visit (INDEPENDENT_AMBULATORY_CARE_PROVIDER_SITE_OTHER): Payer: Medicaid Other | Admitting: Obstetrics and Gynecology

## 2019-04-25 VITALS — BP 116/76 | HR 83 | Ht 67.0 in | Wt 341.7 lb

## 2019-04-25 DIAGNOSIS — Z3009 Encounter for other general counseling and advice on contraception: Secondary | ICD-10-CM | POA: Diagnosis not present

## 2019-04-25 NOTE — Progress Notes (Addendum)
HPI:      Shirley Hayes is a 24 y.o. [phone] who LMP was No LMP recorded. Patient has had an implant.  Subjective:   She presents today in preparation for gastric bypass surgery.  She has a Nexplanon and it expires in April.  Her surgeon has requested that she have the Nexplanon removed and IUD placed. Patient has had IUD before but had some pelvic discomfort but is willing to try it again. Of significant note patient has had 3 pregnancies.    Hx: The following portions of the patient's history were reviewed and updated as appropriate:             She  has a past medical history of Anxiety, Asthma, Depression, Diabetes mellitus without complication (HCC), GERD (gastroesophageal reflux disease), Gestational diabetes, Hypothyroidism, and Mental disorder. She does not have any pertinent problems on file. She  has a past surgical history that includes Cesarean section ([id number]); laparoscopy (02/22/2012); Cholecystectomy (N/A, 01/01/2015); and Cesarean section (N/A, 09/03/2015). Her family history includes Diabetes in her mother; Hypertension in her maternal grandmother, mother, sister, and another family member. She  reports that she quit smoking about 5 years ago. Her smoking use included cigarettes. She has a 3.00 pack-year smoking history. She has never used smokeless tobacco. She reports that she does not drink alcohol or use drugs. She currently has no medications in their medication list. She is allergic to chlorhexidine gluconate.       Review of Systems:  Review of Systems  Constitutional: Denied constitutional symptoms, night sweats, recent illness, fatigue, fever, insomnia and weight loss.  Eyes: Denied eye symptoms, eye pain, photophobia, vision change and visual disturbance.  Ears/Nose/Throat/Neck: Denied ear, nose, throat or neck symptoms, hearing loss, nasal discharge, sinus congestion and sore throat.  Cardiovascular: Denied cardiovascular symptoms, arrhythmia, chest  pain/pressure, edema, exercise intolerance, orthopnea and palpitations.  Respiratory: Denied pulmonary symptoms, asthma, pleuritic pain, productive sputum, cough, dyspnea and wheezing.  Gastrointestinal: Denied, gastro-esophageal reflux, melena, nausea and vomiting.  Genitourinary: Denied genitourinary symptoms including symptomatic vaginal discharge, pelvic relaxation issues, and urinary complaints.  Musculoskeletal: Denied musculoskeletal symptoms, stiffness, swelling, muscle weakness and myalgia.  Dermatologic: Denied dermatology symptoms, rash and scar.  Neurologic: Denied neurology symptoms, dizziness, headache, neck pain and syncope.  Psychiatric: Denied psychiatric symptoms, anxiety and depression.  Endocrine: Denied endocrine symptoms including hot flashes and night sweats.   Meds:   No current outpatient medications on file prior to visit.   No current facility-administered medications on file prior to visit.    Objective:     Vitals:   04/25/19 0852  BP: 116/76  Pulse: 83                Assessment:    [id number] Patient Active Problem List   Diagnosis Date Noted  . S/P cesarean section 09/03/2015  . Labor and delivery, indication for care 08/29/2015     1. Birth control counseling        Plan:            1.  We specifically discussed Nexplanon removal, use of Mirena IUD.  The risks and benefits of Nexplanon and IUD discussed in detail.  All questions answered.  She will schedule for Nexplanon removal and IUD placement in the near future. Orders No orders of the defined types were placed in this encounter.   No orders of the defined types were placed in this encounter.     F/U  No follow-ups on file.  I spent16 minutes involved in the care of this patient preparing to see the patient by obtaining and reviewing her medical history (including labs, imaging tests and prior procedures), documenting clinical information in the electronic health record (EHR),  counseling and coordinating care plans, writing and sending prescriptions, ordering tests or procedures and directly communicating with the patient by discussing pertinent items from her history and physical exam as well as detailing my assessment and plan as noted above so that she has an informed understanding.  All of her questions were answered.  Finis Bud, M.D. 04/25/2019 9:13 AM

## 2019-04-25 NOTE — Progress Notes (Signed)
Patient comes in as new patient. She is going to have gastric surgery. Her surgeon would like her to have nexplanon removed and have IUD placed.

## 2019-05-02 ENCOUNTER — Ambulatory Visit (INDEPENDENT_AMBULATORY_CARE_PROVIDER_SITE_OTHER): Payer: Medicaid Other | Admitting: Obstetrics and Gynecology

## 2019-05-02 ENCOUNTER — Encounter: Payer: Self-pay | Admitting: Obstetrics and Gynecology

## 2019-05-02 ENCOUNTER — Other Ambulatory Visit: Payer: Self-pay

## 2019-05-02 VITALS — BP 140/85 | HR 103 | Ht 67.0 in | Wt 335.0 lb

## 2019-05-02 DIAGNOSIS — Z3046 Encounter for surveillance of implantable subdermal contraceptive: Secondary | ICD-10-CM | POA: Diagnosis not present

## 2019-05-02 DIAGNOSIS — Z30430 Encounter for insertion of intrauterine contraceptive device: Secondary | ICD-10-CM | POA: Diagnosis not present

## 2019-05-02 DIAGNOSIS — Z3009 Encounter for other general counseling and advice on contraception: Secondary | ICD-10-CM

## 2019-05-02 NOTE — Progress Notes (Signed)
HPI:      [name] is a 24 y.o. [phone] who LMP was No LMP recorded. Patient has had an implant.  Subjective:   She presents today for removal of expiring Nexplanon and IUD insertion. Patient continues on the path to having gastric bypass surgery.  She has lost 15 pounds on the path of losing 25 so she is eligible for the surgery.  She continues with the rest of her appointments required before surgery.  She is anticipating surgery in approximately June. She has not had menstrual periods on Nexplanon and desires IUD for continued birth control/cycle control.    Hx: The following portions of the patient's history were reviewed and updated as appropriate:             She  has a past medical history of Anxiety, Asthma, Depression, Diabetes mellitus without complication (HCC), GERD (gastroesophageal reflux disease), Gestational diabetes, Hypothyroidism, and Mental disorder. She does not have any pertinent problems on file. She  has a past surgical history that includes Cesarean section ([id number]); laparoscopy (02/22/2012); Cholecystectomy (N/A, 01/01/2015); and Cesarean section (N/A, 09/03/2015). Her family history includes Diabetes in her mother; Hypertension in her maternal grandmother, mother, sister, and another family member. She  reports that she quit smoking about 5 years ago. Her smoking use included cigarettes. She has a 3.00 pack-year smoking history. She has never used smokeless tobacco. She reports that she does not drink alcohol or use drugs. She currently has no medications in their medication list. She is allergic to chlorhexidine gluconate.       Review of Systems:  Review of Systems  Constitutional: Denied constitutional symptoms, night sweats, recent illness, fatigue, fever, insomnia and weight loss.  Eyes: Denied eye symptoms, eye pain, photophobia, vision change and visual disturbance.  Ears/Nose/Throat/Neck: Denied ear, nose, throat or neck symptoms, hearing loss,  nasal discharge, sinus congestion and sore throat.  Cardiovascular: Denied cardiovascular symptoms, arrhythmia, chest pain/pressure, edema, exercise intolerance, orthopnea and palpitations.  Respiratory: Denied pulmonary symptoms, asthma, pleuritic pain, productive sputum, cough, dyspnea and wheezing.  Gastrointestinal: Denied, gastro-esophageal reflux, melena, nausea and vomiting.  Genitourinary: Denied genitourinary symptoms including symptomatic vaginal discharge, pelvic relaxation issues, and urinary complaints.  Musculoskeletal: Denied musculoskeletal symptoms, stiffness, swelling, muscle weakness and myalgia.  Dermatologic: Denied dermatology symptoms, rash and scar.  Neurologic: Denied neurology symptoms, dizziness, headache, neck pain and syncope.  Psychiatric: Denied psychiatric symptoms, anxiety and depression.  Endocrine: Denied endocrine symptoms including hot flashes and night sweats.   Meds:   No current outpatient medications on file prior to visit.   No current facility-administered medications on file prior to visit.    Objective:     Vitals:   05/02/19 0853  BP: 140/85  Pulse: (!) 103     Nexplanon removal Procedure note - The implant was noted in the patient's arm and the end was identified. The skin was cleansed with a Betadine solution. A small injection of subcutaneous lidocaine with epinephrine was given over the end of the rod. An incision was made at the end of the implant. The rod was noted in the incision and grasp with a hemostat. The capsule was nicked and the rod was again grasped and removed. It was noted to be intact.  [name] skin was again cleansed.  Steri-Strips were placed approximating the incision. Hemostasis was noted.   Physical examination   Pelvic:   Vulva: Normal appearance.  No lesions.  Vagina: No lesions or abnormalities noted.  Support: Normal  pelvic support.  Urethra No masses tenderness or scarring.  Meatus Normal size without  lesions or prolapse.  Cervix: Normal appearance.  No lesions.  Anus: Normal exam.  No lesions.  Perineum: Normal exam.  No lesions.        Bimanual   Uterus: Normal size.  Non-tender.  Mobile.  AV.  Adnexae: No masses.  Non-tender to palpation.  Cul-de-sac: Negative for abnormality.   IUD Procedure Pt has read the booklet and signed the appropriate forms regarding the Mirena IUD.  All of her questions have been answered.   The cervix was cleansed with betadine solution.  After sounding the uterus and noting the position, the IUD was placed in the usual manner without problem.  The string was cut to the appropriate length.  The patient tolerated the procedure well.              Assessment:    [id number] Patient Active Problem List   Diagnosis Date Noted  . S/P cesarean section 09/03/2015  . Labor and delivery, indication for care 08/29/2015     1. Birth control counseling   2. Encounter for insertion of mirena IUD   3. Nexplanon removal       Plan:             F/U  Return in about 4 weeks (around 05/30/2019) for For IUD f/u.   I spent 14 minutes involved in the care of this patient preparing to see the patient by obtaining and reviewing her medical history (including labs, imaging tests and prior procedures), documenting clinical information in the electronic health record (EHR), counseling and coordinating care plans, writing and sending prescriptions, ordering tests or procedures and directly communicating with the patient by discussing pertinent items from her history and physical exam as well as detailing my assessment and plan as noted above so that she has an informed understanding.  All of her questions were answered.  Finis Bud, M.D. 05/02/2019 9:56 AM

## 2019-05-30 ENCOUNTER — Other Ambulatory Visit: Payer: Self-pay

## 2019-05-30 ENCOUNTER — Encounter: Payer: Self-pay | Admitting: Obstetrics and Gynecology

## 2019-05-30 ENCOUNTER — Ambulatory Visit (INDEPENDENT_AMBULATORY_CARE_PROVIDER_SITE_OTHER): Payer: Medicaid Other | Admitting: Obstetrics and Gynecology

## 2019-05-30 VITALS — BP 128/72 | HR 78 | Ht 67.0 in | Wt 332.2 lb

## 2019-05-30 DIAGNOSIS — Z30431 Encounter for routine checking of intrauterine contraceptive device: Secondary | ICD-10-CM

## 2019-05-30 DIAGNOSIS — R102 Pelvic and perineal pain: Secondary | ICD-10-CM | POA: Diagnosis not present

## 2019-05-30 LAB — POCT URINALYSIS DIPSTICK
Bilirubin, UA: NEGATIVE
Glucose, UA: NEGATIVE
Ketones, UA: NEGATIVE
Leukocytes, UA: NEGATIVE
Nitrite, UA: NEGATIVE
Protein, UA: POSITIVE — AB
Spec Grav, UA: 1.01 (ref 1.010–1.025)
Urobilinogen, UA: 0.2 E.U./dL
pH, UA: 6 (ref 5.0–8.0)

## 2019-05-30 NOTE — Progress Notes (Signed)
HPI:      [name] is a 24 y.o. [phone] who LMP was No LMP recorded. (Menstrual status: IUD).  Subjective:   She presents today for IUD follow-up.  She reports no problems with her IUD.  She is not bleeding.  She does complain of intermittent pain mostly on her left side but occasionally on her right as well.  She says that she has had "UTIs in the past but usually they are associated with burning ".  Her pain is not unmanageable but annoying.  She reports the pain is not worse with bowel movements or intercourse.    Hx: The following portions of the patient's history were reviewed and updated as appropriate:             She  has a past medical history of Anxiety, Asthma, Depression, Diabetes mellitus without complication ([place]), GERD (gastroesophageal reflux disease), Gestational diabetes, Hypothyroidism, and Mental disorder. She does not have any pertinent problems on file. She  has a past surgical history that includes Cesarean section ([id number]); laparoscopy (02/22/2012); Cholecystectomy (N/A, 01/01/2015); and Cesarean section (N/A, 09/03/2015). Her family history includes Diabetes in her mother; Hypertension in her maternal grandmother, mother, sister, and another family member. She  reports that she quit smoking about 5 years ago. Her smoking use included cigarettes. She has a 3.00 pack-year smoking history. She has never used smokeless tobacco. She reports that she does not drink alcohol or use drugs. She currently has no medications in their medication list. She is allergic to chlorhexidine gluconate.       Review of Systems:  Review of Systems  Constitutional: Denied constitutional symptoms, night sweats, recent illness, fatigue, fever, insomnia and weight loss.  Eyes: Denied eye symptoms, eye pain, photophobia, vision change and visual disturbance.  Ears/Nose/Throat/Neck: Denied ear, nose, throat or neck symptoms, hearing loss, nasal discharge, sinus congestion and sore  throat.  Cardiovascular: Denied cardiovascular symptoms, arrhythmia, chest pain/pressure, edema, exercise intolerance, orthopnea and palpitations.  Respiratory: Denied pulmonary symptoms, asthma, pleuritic pain, productive sputum, cough, dyspnea and wheezing.  Gastrointestinal: Denied, gastro-esophageal reflux, melena, nausea and vomiting.  Genitourinary: Denied genitourinary symptoms including symptomatic vaginal discharge, pelvic relaxation issues, and urinary complaints.  Musculoskeletal: Denied musculoskeletal symptoms, stiffness, swelling, muscle weakness and myalgia.  Dermatologic: Denied dermatology symptoms, rash and scar.  Neurologic: Denied neurology symptoms, dizziness, headache, neck pain and syncope.  Psychiatric: Denied psychiatric symptoms, anxiety and depression.  Endocrine: Denied endocrine symptoms including hot flashes and night sweats.   Meds:   No current outpatient medications on file prior to visit.   No current facility-administered medications on file prior to visit.    Objective:     Vitals:   05/30/19 1001  BP: 128/72  Pulse: 78              Physical examination   Pelvic:   Vulva: Normal appearance.  No lesions.  Vagina: No lesions or abnormalities noted.  Support: Normal pelvic support.  Urethra No masses tenderness or scarring.  Meatus Normal size without lesions or prolapse.  Cervix: Normal appearance.  No lesions. IUD strings noted at cervical os.  Anus: Normal exam.  No lesions.  Perineum: Normal exam.  No lesions.    Assessment:    [id number] Patient Active Problem List   Diagnosis Date Noted  . S/P cesarean section 09/03/2015  . Labor and delivery, indication for care 08/29/2015     1. Surveillance of previously prescribed intrauterine contraceptive device   2. Pelvic pain  in female     IUD-no issues  Possible UTI   Plan:            1.  Patient to leave a urine for UA and C&S  2.  If urine negative-expectant management for 1  week.  If pain continues consider pelvic ultrasound to rule out ovarian cyst or other pathology.  Orders No orders of the defined types were placed in this encounter.   No orders of the defined types were placed in this encounter.     F/U  No follow-ups on file. I spent 23 minutes involved in the care of this patient preparing to see the patient by obtaining and reviewing her medical history (including labs, imaging tests and prior procedures), documenting clinical information in the electronic health record (EHR), counseling and coordinating care plans, writing and sending prescriptions, ordering tests or procedures and directly communicating with the patient by discussing pertinent items from her history and physical exam as well as detailing my assessment and plan as noted above so that she has an informed understanding.  All of her questions were answered.  Elonda Husky, M.D. 05/30/2019 10:37 AM

## 2019-05-30 NOTE — Addendum Note (Signed)
Addended by: Dorian Pod on: 05/30/2019 10:51 AM   Modules accepted: Orders

## 2019-06-01 LAB — URINE CULTURE

## 2019-09-09 DIAGNOSIS — G4733 Obstructive sleep apnea (adult) (pediatric): Secondary | ICD-10-CM | POA: Insufficient documentation

## 2019-12-24 HISTORY — PX: GASTRIC BYPASS: SHX52

## 2020-10-07 ENCOUNTER — Other Ambulatory Visit: Payer: Self-pay

## 2020-10-07 ENCOUNTER — Encounter: Payer: Self-pay | Admitting: Certified Nurse Midwife

## 2020-10-07 ENCOUNTER — Ambulatory Visit (INDEPENDENT_AMBULATORY_CARE_PROVIDER_SITE_OTHER): Payer: Medicaid Other | Admitting: Certified Nurse Midwife

## 2020-10-07 VITALS — BP 96/57 | HR 52 | Ht 67.0 in | Wt 212.1 lb

## 2020-10-07 DIAGNOSIS — Z3200 Encounter for pregnancy test, result unknown: Secondary | ICD-10-CM

## 2020-10-07 DIAGNOSIS — Z9884 Bariatric surgery status: Secondary | ICD-10-CM

## 2020-10-07 LAB — POCT URINE PREGNANCY: Preg Test, Ur: POSITIVE — AB

## 2020-10-07 MED ORDER — DOXYLAMINE-PYRIDOXINE 10-10 MG PO TBEC: 1.0000 | DELAYED_RELEASE_TABLET | Freq: Four times a day (QID) | ORAL | 5 refills | Status: DC

## 2020-10-07 NOTE — Progress Notes (Signed)
Subjective:    Shirley Hayes is a 25 y.o. female who presents for evaluation of amenorrhea. She believes she could be pregnant. Pregnancy is desired. Sexual Activity: single partner, contraception: none. Current symptoms also include: nausea. Last period was normal.   Patient's last menstrual period was 08/05/2020 (approximate). The following portions of the patient's history were reviewed and updated as appropriate: allergies, current medications, past family history, past medical history, past social history, past surgical history, and problem list.  Review of Systems Pertinent items are noted in HPI.     Objective:    BP (!) 96/57   Pulse (!) 52   Ht 5\' 7"  (1.702 m)   Wt 212 lb 1.6 oz (96.2 kg)   LMP 08/05/2020 (Approximate)   BMI 33.22 kg/m  General: alert, cooperative, appears stated age, and no acute distress    Lab Review Urine HCG: positive    Assessment:    Absence of menstruation.     Plan:    Pregnancy Test:  Positive: EDC: 05/12/21. Briefly discussed pre-natal care options. Md and midwifery care reviewed. She request to see midwife. Encouraged well-balanced diet, plenty of rest when needed, pre-natal vitamins daily and walking for exercise. Discussed self-help for nausea, avoiding OTC medications until consulting provider or pharmacist, other than Tylenol as needed, minimal caffeine (1-2 cups daily) and avoiding alcohol. She will schedule  nurse visit next week and her initial NOB visit with me in 2 wks.. Feel free to call with any questions. Diclegis ordered for nausea.   05/14/21, CNM

## 2020-10-07 NOTE — Patient Instructions (Signed)
https://www.[facility].org/womens-health/faqs/prenatal-genetic-screening-tests">  Prenatal Care Prenatal care is health care during pregnancy. It helps you and your unborn baby (fetus) stay as healthy as possible. Prenatal care may be provided by a midwife, a family practice doctor, a mid-level practitioner (nurse practitioner or physician assistant), or a childbirth and pregnancy doctor (obstetrician). How does this affect me? During pregnancy, you will be closely monitored for any new conditions that might develop. To lower your risk of pregnancy complications, you and yourhealth care provider will talk about any underlying conditions you have. How does this affect my baby? Early and consistent prenatal care increases the chance that your baby will be healthy during pregnancy. Prenatal care lowers the risk that your baby will be: Born early (prematurely). Smaller than expected at birth (small for gestational age). What can I expect at the first prenatal care visit? Your first prenatal care visit will likely be the longest. You should schedule your first prenatal care visit as soon as you know that you are pregnant. Your first visit is a good time to talk about any questions or concerns you haveabout pregnancy. Medical history At your visit, you and your health care provider will talk about your medical history, including: Any past pregnancies. Your family's medical history. Medical history of the baby's father. Any long-term (chronic) health conditions you have and how you manage them. Any surgeries or procedures you have had. Any current over-the-counter or prescription medicines, herbs, or supplements that you are taking. Other factors that could pose a risk to your baby, including: Exposure to harmful chemicals or radiation at work or at home. Any substance use, including tobacco, alcohol, and drug use. Your home setting and your stress levels, including: Exposure to abuse or  violence. Household financial strain. Your daily health habits, including diet and exercise. Tests and screenings Your health care provider will: Measure your weight, height, and blood pressure. Do a physical exam, including a pelvic and breast exam. Perform blood tests and urine tests to check for: Urinary tract infection. Sexually transmitted infections (STIs). Low iron levels in your blood (anemia). Blood type and certain proteins on red blood cells (Rh antibodies). Infections and immunity to viruses, such as hepatitis B and rubella. HIV (human immunodeficiency virus). Discuss your options for genetic screening. Tips about staying healthy Your health care provider will also give you information about how to keep yourself and your baby healthy, including: Nutrition and taking vitamins. Physical activity. How to manage pregnancy symptoms such as nausea and vomiting (morning sickness). Infections and substances that may be harmful to your baby and how to avoid them. Food safety. Dental care. Working. Travel. Warning signs to watch for and when to call your health care provider. How often will I have prenatal care visits? After your first prenatal care visit, you will have regular visits throughout your pregnancy. The visit schedule is often as follows: Up to week 28 of pregnancy: once every 4 weeks. 28-36 weeks: once every 2 weeks. After 36 weeks: every week until delivery. Some women may have visits more or less often depending on any underlyinghealth conditions and the health of the baby. Keep all follow-up and prenatal care visits. This is important. What happens during routine prenatal care visits? Your health care provider will: Measure your weight and blood pressure. Check for fetal heart sounds. Measure the height of your uterus in your abdomen (fundal height). This may be measured starting around week 20 of pregnancy. Check the position of your baby inside your  uterus. Ask questions   about your diet, sleeping patterns, and whether you can feel the baby move. Review warning signs to watch for and signs of labor. Ask about any pregnancy symptoms you are having and how you are dealing with them. Symptoms may include: Headaches. Nausea and vomiting. Vaginal discharge. Swelling. Fatigue. Constipation. Changes in your vision. Feeling persistently sad or anxious. Any discomfort, including back or pelvic pain. Bleeding or spotting. Make a list of questions to ask your health care provider at your routinevisits. What tests might I have during prenatal care visits? You may have blood, urine, and imaging tests throughout your pregnancy, such as: Urine tests to check for glucose, protein, or signs of infection. Glucose tests to check for a form of diabetes that can develop during pregnancy (gestational diabetes mellitus). This is usually done around week 24 of pregnancy. Ultrasounds to check your baby's growth and development, to check for birth defects, and to check your baby's well-being. These can also help to decide when you should deliver your baby. A test to check for group B strep (GBS) infection. This is usually done around week 36 of pregnancy. Genetic testing. This may include blood, fluid, or tissue sampling, or imaging tests, such as an ultrasound. Some genetic tests are done during the first trimester and some are done during the second trimester. What else can I expect during prenatal care visits? Your health care provider may recommend getting certain vaccines during pregnancy. These may include: A yearly flu shot (annual influenza vaccine). This is especially important if you will be pregnant during flu season. Tdap (tetanus, diphtheria, pertussis) vaccine. Getting this vaccine during pregnancy can protect your baby from whooping cough (pertussis) after birth. This vaccine may be recommended between weeks 27 and 36 of pregnancy. A COVID-19  vaccine. Later in your pregnancy, your health care provider may give you information about: Childbirth and breastfeeding classes. Choosing a health care provider for your baby. Umbilical cord banking. Breastfeeding. Birth control after your baby is born. The hospital labor and delivery unit and how to set up a tour. Registering at the hospital before you go into labor. Where to find more information Office on Women's Health: womenshealth.gov American Pregnancy Association: americanpregnancy.org March of Dimes: marchofdimes.org Summary Prenatal care helps you and your baby stay as healthy as possible during pregnancy. Your first prenatal care visit will most likely be the longest. You will have visits and tests throughout your pregnancy to monitor your health and your baby's health. Bring a list of questions to your visits to ask your health care provider. Make sure to keep all follow-up and prenatal care visits. This information is not intended to replace advice given to you by your health care provider. Make sure you discuss any questions you have with your healthcare provider. Document Revised: 11/21/2019 Document Reviewed: 11/21/2019 Elsevier Patient Education  2022 [employer]  

## 2020-10-15 ENCOUNTER — Ambulatory Visit (INDEPENDENT_AMBULATORY_CARE_PROVIDER_SITE_OTHER): Payer: Medicaid Other | Admitting: Certified Nurse Midwife

## 2020-10-15 ENCOUNTER — Other Ambulatory Visit: Payer: Self-pay

## 2020-10-15 VITALS — BP 127/76 | Wt 208.0 lb

## 2020-10-15 DIAGNOSIS — T7589XA Other specified effects of external causes, initial encounter: Secondary | ICD-10-CM

## 2020-10-15 DIAGNOSIS — Z3401 Encounter for supervision of normal first pregnancy, first trimester: Secondary | ICD-10-CM

## 2020-10-15 DIAGNOSIS — O99210 Obesity complicating pregnancy, unspecified trimester: Secondary | ICD-10-CM

## 2020-10-15 DIAGNOSIS — Z3A10 10 weeks gestation of pregnancy: Secondary | ICD-10-CM | POA: Diagnosis not present

## 2020-10-15 DIAGNOSIS — Z113 Encounter for screening for infections with a predominantly sexual mode of transmission: Secondary | ICD-10-CM

## 2020-10-15 NOTE — Progress Notes (Signed)
Catalina Pizza presents for NOB nurse interview visit. Pregnancy confirmation done 10/07/2020.  [id number] . Pregnancy education material explained and given. 7 cats in the home. NOB labs ordered. TSH/HbgA1c due to Increased BMI. Body mass index is 32.58 kg/m.  HIV labs and Drug screen were explained optional and she did not decline. Drug screen ordered. PNV encouraged. Genetic screening options discussed. Genetic testing: she prefers to do Materni21.  Pt may discuss with provider.  Financial policy not applicable. FMLA from reviewed and signed. Pt. To follow up with provider in 5 days for NOB physical.  All questions answered.

## 2020-10-15 NOTE — Patient Instructions (Signed)
Commonly Asked Questions During Pregnancy Cats: A parasite can be excreted in cat feces.  To avoid exposure you need to have another person empty the little box.  If you must empty the litter box you will need to wear gloves.  Wash your hands after handling your cat.  This parasite can also be found in raw or undercooked meat so this should also be avoided. Colds, Sore Throats, Flu: Please check your medication sheet to see what you can take for symptoms.  If your symptoms are unrelieved by these medications please call the office. Dental Work: Most any dental work Agricultural consultant recommends is permitted.  X-rays should only be taken during the first trimester if absolutely necessary.  Your abdomen should be shielded with a lead apron during all x-rays.  Please notify your provider prior to receiving any x-rays.  Novocaine is fine; gas is not recommended.  If your dentist requires a note from [place] prior to dental work please call the office and we will provide one for you. Exercise: Exercise is an important part of staying healthy during your pregnancy.  You may continue most exercises you were accustomed to prior to pregnancy.  Later in your pregnancy you will most likely notice you have difficulty with activities requiring balance like riding a bicycle.  It is important that you listen to your body and avoid activities that put you at a higher risk of falling.  Adequate rest and staying well hydrated are a must!  If you have questions about the safety of specific activities ask your provider.   Exposure to Children with illness: Try to avoid obvious exposure; report any symptoms to [place] when noted,  If you have chicken pos, red measles or mumps, you should be immune to these diseases.   Please do not take any vaccines while pregnant unless you have checked with your OB provider. Fetal Movement: After 28 weeks we recommend you do "kick counts" twice daily.  Lie or sit down in a calm quiet environment and count your  baby movements "kicks".  You should feel your baby at least 10 times per hour.  If you have not felt 10 kicks within the first hour get up, walk around and have something sweet to eat or drink then repeat for an additional hour.  If count remains less than 10 per hour notify your provider. Fumigating: Follow your pest control agent's advice as to how long to stay out of your home.  Ventilate the area well before re-entering. Hemorrhoids:   Most over-the-counter preparations can be used during pregnancy.  Check your medication to see what is safe to use.  It is important to use a stool softener or fiber in your diet and to drink lots of liquids.  If hemorrhoids seem to be getting worse please call the office.  Hot Tubs:  Hot tubs Jacuzzis and saunas are not recommended while pregnant.  These increase your internal body temperature and should be avoided. Intercourse:  Sexual intercourse is safe during pregnancy as long as you are comfortable, unless otherwise advised by your provider.  Spotting may occur after intercourse; report any bright red bleeding that is heavier than spotting. Labor:  If you know that you are in labor, please go to the hospital.  If you are unsure, please call the office and let us help you decide what to do. Lifting, straining, etc:  If your job requires heavy lifting or straining please check with your provider for any limitations.  Generally, you should not lift items heavier than that you can lift simply with your hands and arms (no back muscles) Painting:  Paint fumes do not harm your pregnancy, but may make you ill and should be avoided if possible.  Latex or water based paints have less odor than oils.  Use adequate ventilation while painting. Permanents & Hair Color:  Chemicals in hair dyes are not recommended as they cause increase hair dryness which can increase hair loss during pregnancy.  " Highlighting" and permanents are allowed.  Dye may be absorbed differently and  permanents may not hold as well during pregnancy. Sunbathing:  Use a sunscreen, as skin burns easily during pregnancy.  Drink plenty of fluids; avoid over heating. Tanning Beds:  Because their possible side effects are still unknown, tanning beds are not recommended. Ultrasound Scans:  Routine ultrasounds are performed at approximately 20 weeks.  You will be able to see your baby's general anatomy an if you would like to know the gender this can usually be determined as well.  If it is questionable when you conceived you may also receive an ultrasound early in your pregnancy for dating purposes.  Otherwise ultrasound exams are not routinely performed unless there is a medical necessity.  Although you can request a scan we ask that you pay for it when conducted because insurance does not cover " patient request" scans. Work: If your pregnancy proceeds without complications you may work until your due date, unless your physician or employer advises otherwise. Round Ligament Pain/Pelvic Discomfort:  Sharp, shooting pains not associated with bleeding are fairly common, usually occurring in the second trimester of pregnancy.  They tend to be worse when standing up or when you remain standing for long periods of time.  These are the result of pressure of certain pelvic ligaments called "round ligaments".  Rest, Tylenol and heat seem to be the most effective relief.  As the womb and fetus grow, they rise out of the pelvis and the discomfort improves.  Please notify the office if your pain seems different than that described.  It may represent a more serious condition. Morning Sickness  Morning sickness is when you feel like you may vomit (feel nauseous) during pregnancy. Sometimes, you may vomit. Morning sickness most often happens in the morning, but it can also happen at any time of the day. Some women may have morning sickness that makes them vomit all the time. This is amore serious problem that needs  treatment. What are the causes? The cause of this condition is not known. What increases the risk? You had vomiting or a feeling like you may vomit before your pregnancy. You had morning sickness in another pregnancy. You are pregnant with more than one baby, such as twins. What are the signs or symptoms? Feeling like you may vomit. Vomiting. How is this treated? Treatment is usually not needed for this condition. You may only need to change what you eat. In some cases, your doctor may give you some things to take for your condition. These include: Vitamin B6 supplements. Medicines to treat the feeling that you may vomit. Ginger. Follow these instructions at home: Medicines Take over-the-counter and prescription medicines only as told by your doctor. Do not take any medicines until you talk with your doctor about them first. Take multivitamins before you get pregnant. These can stop or lessen the symptoms of morning sickness. Eating and drinking Eat dry toast or crackers before getting out of bed. Eat 5  or 6 small meals a day. Eat dry and bland foods like rice and baked potatoes. Do not eat greasy, fatty, or spicy foods. Have someone cook for you if the smell of food causes you to vomit or to feel like you may vomit. If you feel like you may vomit after taking prenatal vitamins, take them at night or with a snack. Eat protein foods when you need a snack. Nuts, yogurt, and cheese are good choices. Drink fluids throughout the day. Try ginger ale made with real ginger, ginger tea made from fresh grated ginger, or ginger candies. General instructions Do not smoke or use any products that contain nicotine or tobacco. If you need help quitting, ask your doctor. Use an air purifier to keep the air in your house free of smells. Get lots of fresh air. Try to avoid smells that make you feel sick. Try wearing an acupressure wristband. This is a wristband that is used to treat seasickness. Try  a treatment called acupuncture. In this treatment, a doctor puts needles into certain areas of your body to make you feel better. Contact a doctor if: You need medicine to feel better. You feel dizzy or light-headed. You are losing weight. Get help right away if: The feeling that you may vomit will not go away, or you cannot stop vomiting. You faint. You have very bad pain in your belly. Summary Morning sickness is when you feel like you may vomit (feel nauseous) during pregnancy. You may feel sick in the morning, but you can feel this way at any time of the day. Making some changes to what you eat may help your symptoms go away. This information is not intended to replace advice given to you by your health care provider. Make sure you discuss any questions you have with your healthcare provider. Document Revised: 09/23/2019 Document Reviewed: 09/02/2019 Elsevier Patient Education  2022 Elsevier Inc. [facility]  [address] [place], [place], [place] [address]  Phone: [phone]  [facility] (second location)  [address] [place], [place] [address]  Phone: [phone]  [facility]) [address] [place], [place], [place] [address] Phone: [phone]  [facility]  [address] [place], [place] [address]  Phone: [phone] Class  August 03, 2016  Wednesday 7:00p - 9:00p  [facility] [place], [place]  September 07, 2016  Wednesday 7:00p - 9:00p [facility] [place], [place]    October 12, 2016   Wednesday 7:00p - 9:000p [facility] [place], [place]  November 09, 2016  Wednesday  7:00p - 9:00p [facility] [place], [place]  December 07, 2016 Wednesday 7:00p - 9:00p [facility] [place], [place]  Interested in a waterbirth?  This informational class will help you discover whether waterbirth is  the right fit for you.  Education about waterbirth itself, supplies you would need and how to assemble your support team is what you can expect from this class.  Some obstetrical practices require this class in order to pursue a waterbirth.  (Not all obstetrical practices offer waterbirth check with your healthcare provider)  Register only the expectant mom, but you are encouraged to bring your partner to class! Fees & Payment No fee Register Online www.ReserveSpaces.se Search Waterbirth http://www.bray.com/.html">  First Trimester of Pregnancy  The first trimester of pregnancy starts on the first day of your last menstrual period until the end of week 12. This is also called  months 1 through 3 ofpregnancy. Body changes during your first trimester Your body goes through many changes during pregnancy. The changes usuallyreturn to normal after your baby is born. Physical changes You may gain or lose weight. Your breasts may grow larger and hurt. The area around your nipples may get darker. Dark spots or blotches may develop on your face. You may have changes in your hair. Health changes You may feel like you might vomit (nauseous), and you may vomit. You may have heartburn. You may have headaches. You may have trouble pooping (constipation). Your gums may bleed. Other changes You may get tired easily. You may pee (urinate) more often. Your menstrual periods will stop. You may not feel hungry. You may want to eat certain kinds of food. You may have changes in your emotions from day to day. You may have more dreams. Follow these instructions at home: Medicines Take over-the-counter and prescription medicines only as told by your doctor. Some medicines are not safe during pregnancy. Take a prenatal vitamin that contains at least 600 micrograms (mcg) of folic acid. Eating and drinking Eat healthy meals that include: Fresh fruits and vegetables. Whole  grains. Good sources of protein, such as meat, eggs, or tofu. Low-fat dairy products. Avoid raw meat and unpasteurized juice, milk, and cheese. If you feel like you may vomit, or you vomit: Eat 4 or 5 small meals a day instead of 3 large meals. Try eating a few soda crackers. Drink liquids between meals instead of during meals. You may need to take these actions to prevent or treat trouble pooping: Drink enough fluids to keep your pee (urine) pale yellow. Eat foods that are high in fiber. These include beans, whole grains, and fresh fruits and vegetables. Limit foods that are high in fat and sugar. These include fried or sweet foods. Activity Exercise only as told by your doctor. Most people can do their usual exercise routine during pregnancy. Stop exercising if you have cramps or pain in your lower belly (abdomen) or low back. Do not exercise if it is too hot or too humid, or if you are in a place of great height (high altitude). Avoid heavy lifting. If you choose to, you may have sex unless your doctor tells you not to. Relieving pain and discomfort Wear a good support bra if your breasts are sore. Rest with your legs raised (elevated) if you have leg cramps or low back pain. If you have bulging veins (varicose veins) in your legs: Wear support hose as told by your doctor. Raise your feet for 15 minutes, 3-4 times a day. Limit salt in your food. Safety Wear your seat belt at all times when you are in a car. Talk with your doctor if someone is hurting you or yelling at you. Talk with your doctor if you are feeling sad or have thoughts of hurting yourself. Lifestyle Do not use hot tubs, steam rooms, or saunas. Do not douche. Do not use tampons or scented sanitary pads. Do not use herbal medicines, illegal drugs, or medicines that are not approved by your doctor. Do not drink alcohol. Do not smoke or use any products that contain nicotine or tobacco. If you need help quitting, ask  your doctor. Avoid cat litter boxes and soil that is used by cats. These carry germs that can cause harm to the baby and can cause a loss of your baby by miscarriage or stillbirth. General instructions Keep all follow-up visits. This is important. Ask for  help if you need counseling or if you need help with nutrition. Your doctor can give you advice or tell you where to go for help. Visit your dentist. At home, brush your teeth with a soft toothbrush. Floss gently. Write down your questions. Take them to your prenatal visits. Where to find more information American Pregnancy Association: americanpregnancy.org [employer]: www.acog.org Office on Women's Health: MightyReward.co.nz Contact a doctor if: You are dizzy. You have a fever. You have mild cramps or pressure in your lower belly. You have a nagging pain in your belly area. You continue to feel like you may vomit, you vomit, or you have watery poop (diarrhea) for 24 hours or longer. You have a bad-smelling fluid coming from your vagina. You have pain when you pee. You are exposed to a disease that spreads from person to person, such as chickenpox, measles, Zika virus, HIV, or hepatitis. Get help right away if: You have spotting or bleeding from your vagina. You have very bad belly cramping or pain. You have shortness of breath or chest pain. You have any kind of injury, such as from a fall or a car crash. You have new or increased pain, swelling, or redness in an arm or leg. Summary The first trimester of pregnancy starts on the first day of your last menstrual period until the end of week 12 (months 1 through 3). Eat 4 or 5 small meals a day instead of 3 large meals. Do not smoke or use any products that contain nicotine or tobacco. If you need help quitting, ask your doctor. Keep all follow-up visits. This information is not intended to replace advice given to you by your health  care provider. Make sure you discuss any questions you have with your healthcare provider. Document Revised: 07/17/2019 Document Reviewed: 05/23/2019 Elsevier Patient Education  2022 [employer].

## 2020-10-16 LAB — URINALYSIS, ROUTINE W REFLEX MICROSCOPIC
Bilirubin, UA: NEGATIVE
Glucose, UA: NEGATIVE
Ketones, UA: NEGATIVE
Leukocytes,UA: NEGATIVE
Nitrite, UA: NEGATIVE
RBC, UA: NEGATIVE
Specific Gravity, UA: 1.028 (ref 1.005–1.030)
Urobilinogen, Ur: 1 mg/dL (ref 0.2–1.0)
pH, UA: 6 (ref 5.0–7.5)

## 2020-10-16 LAB — NICOTINE SCREEN, URINE: Cotinine Ql Scrn, Ur: POSITIVE ng/mL — AB

## 2020-10-17 LAB — TOXOPLASMA ANTIBODIES- IGG AND  IGM
Toxoplasma Antibody- IgM: <3 AU/mL (ref 0.0–7.9)
Toxoplasma IgG Ratio: <3 IU/mL (ref 0.0–7.1)

## 2020-10-17 LAB — PARVOVIRUS B19 ANTIBODY, IGG AND IGM
Parvovirus B19 IgG: 7.6 index — ABNORMAL HIGH (ref 0.0–0.8)
Parvovirus B19 IgM: 0.3 index (ref 0.0–0.8)

## 2020-10-17 LAB — ABO AND RH: Rh Factor: NEGATIVE

## 2020-10-17 LAB — CULTURE, OB URINE

## 2020-10-17 LAB — ANTIBODY SCREEN: Antibody Screen: NEGATIVE

## 2020-10-17 LAB — VIRAL HEPATITIS HBV, HCV
HCV Ab: <0.1 s/co ratio (ref 0.0–0.9)
Hep B Core Total Ab: NEGATIVE
Hep B Surface Ab, Qual: NONREACTIVE
Hepatitis B Surface Ag: NEGATIVE

## 2020-10-17 LAB — RPR: RPR Ser Ql: NONREACTIVE

## 2020-10-17 LAB — RUBELLA SCREEN: Rubella Antibodies, IGG: 0.99 index — ABNORMAL LOW (ref >0.99)

## 2020-10-17 LAB — URINE CULTURE, OB REFLEX

## 2020-10-17 LAB — HEMOGLOBIN A1C
Est. average glucose Bld gHb Est-mCnc: 80 mg/dL
Hgb A1c MFr Bld: 4.4 % — ABNORMAL LOW (ref 4.8–5.6)

## 2020-10-17 LAB — TSH: TSH: 1.73 u[IU]/mL (ref 0.450–4.500)

## 2020-10-17 LAB — HCV INTERPRETATION

## 2020-10-17 LAB — HIV ANTIBODY (ROUTINE TESTING W REFLEX): HIV Screen 4th Generation wRfx: NONREACTIVE

## 2020-10-17 LAB — VARICELLA ZOSTER ANTIBODY, IGG: Varicella zoster IgG: <135 index — ABNORMAL LOW (ref >165)

## 2020-10-19 LAB — GC/CHLAMYDIA PROBE AMP
Chlamydia trachomatis, NAA: NEGATIVE
Neisseria Gonorrhoeae by PCR: NEGATIVE

## 2020-10-20 ENCOUNTER — Other Ambulatory Visit: Payer: Self-pay

## 2020-10-20 ENCOUNTER — Ambulatory Visit (INDEPENDENT_AMBULATORY_CARE_PROVIDER_SITE_OTHER): Payer: Medicaid Other | Admitting: Certified Nurse Midwife

## 2020-10-20 ENCOUNTER — Other Ambulatory Visit (HOSPITAL_COMMUNITY)
Admission: RE | Admit: 2020-10-20 | Discharge: 2020-10-20 | Disposition: A | Discharge status: Home / Self Care | Payer: Medicaid Other | Source: Ambulatory Visit | Attending: Certified Nurse Midwife | Admitting: Certified Nurse Midwife

## 2020-10-20 VITALS — BP 117/75 | HR 77 | Wt 206.3 lb

## 2020-10-20 DIAGNOSIS — Z3481 Encounter for supervision of other normal pregnancy, first trimester: Secondary | ICD-10-CM | POA: Diagnosis not present

## 2020-10-20 DIAGNOSIS — Z124 Encounter for screening for malignant neoplasm of cervix: Secondary | ICD-10-CM | POA: Insufficient documentation

## 2020-10-20 DIAGNOSIS — Z3A10 10 weeks gestation of pregnancy: Secondary | ICD-10-CM | POA: Diagnosis not present

## 2020-10-20 DIAGNOSIS — Z8632 Personal history of gestational diabetes: Secondary | ICD-10-CM | POA: Diagnosis not present

## 2020-10-20 DIAGNOSIS — Z8639 Personal history of other endocrine, nutritional and metabolic disease: Secondary | ICD-10-CM | POA: Diagnosis not present

## 2020-10-20 LAB — POCT URINALYSIS DIPSTICK OB
Blood, UA: NEGATIVE
Leukocytes, UA: NEGATIVE
Nitrite, UA: NEGATIVE
Spec Grav, UA: 1.025 (ref 1.010–1.025)
Urobilinogen, UA: 0.2 E.U./dL
pH, UA: 5 (ref 5.0–8.0)

## 2020-10-20 NOTE — Patient Instructions (Signed)
https://www.[facility].org/womens-health/faqs/prenatal-genetic-screening-tests">  Prenatal Care Prenatal care is health care during pregnancy. It helps you and your unborn baby (fetus) stay as healthy as possible. Prenatal care may be provided by a midwife, a family practice doctor, a mid-level practitioner (nurse practitioner or physician assistant), or a childbirth and pregnancy doctor (obstetrician). How does this affect me? During pregnancy, you will be closely monitored for any new conditions that might develop. To lower your risk of pregnancy complications, you and yourhealth care provider will talk about any underlying conditions you have. How does this affect my baby? Early and consistent prenatal care increases the chance that your baby will be healthy during pregnancy. Prenatal care lowers the risk that your baby will be: Born early (prematurely). Smaller than expected at birth (small for gestational age). What can I expect at the first prenatal care visit? Your first prenatal care visit will likely be the longest. You should schedule your first prenatal care visit as soon as you know that you are pregnant. Your first visit is a good time to talk about any questions or concerns you haveabout pregnancy. Medical history At your visit, you and your health care provider will talk about your medical history, including: Any past pregnancies. Your family's medical history. Medical history of the baby's father. Any long-term (chronic) health conditions you have and how you manage them. Any surgeries or procedures you have had. Any current over-the-counter or prescription medicines, herbs, or supplements that you are taking. Other factors that could pose a risk to your baby, including: Exposure to harmful chemicals or radiation at work or at home. Any substance use, including tobacco, alcohol, and drug use. Your home setting and your stress levels, including: Exposure to abuse or  violence. Household financial strain. Your daily health habits, including diet and exercise. Tests and screenings Your health care provider will: Measure your weight, height, and blood pressure. Do a physical exam, including a pelvic and breast exam. Perform blood tests and urine tests to check for: Urinary tract infection. Sexually transmitted infections (STIs). Low iron levels in your blood (anemia). Blood type and certain proteins on red blood cells (Rh antibodies). Infections and immunity to viruses, such as hepatitis B and rubella. HIV (human immunodeficiency virus). Discuss your options for genetic screening. Tips about staying healthy Your health care provider will also give you information about how to keep yourself and your baby healthy, including: Nutrition and taking vitamins. Physical activity. How to manage pregnancy symptoms such as nausea and vomiting (morning sickness). Infections and substances that may be harmful to your baby and how to avoid them. Food safety. Dental care. Working. Travel. Warning signs to watch for and when to call your health care provider. How often will I have prenatal care visits? After your first prenatal care visit, you will have regular visits throughout your pregnancy. The visit schedule is often as follows: Up to week 28 of pregnancy: once every 4 weeks. 28-36 weeks: once every 2 weeks. After 36 weeks: every week until delivery. Some women may have visits more or less often depending on any underlyinghealth conditions and the health of the baby. Keep all follow-up and prenatal care visits. This is important. What happens during routine prenatal care visits? Your health care provider will: Measure your weight and blood pressure. Check for fetal heart sounds. Measure the height of your uterus in your abdomen (fundal height). This may be measured starting around week 20 of pregnancy. Check the position of your baby inside your  uterus. Ask questions   about your diet, sleeping patterns, and whether you can feel the baby move. Review warning signs to watch for and signs of labor. Ask about any pregnancy symptoms you are having and how you are dealing with them. Symptoms may include: Headaches. Nausea and vomiting. Vaginal discharge. Swelling. Fatigue. Constipation. Changes in your vision. Feeling persistently sad or anxious. Any discomfort, including back or pelvic pain. Bleeding or spotting. Make a list of questions to ask your health care provider at your routinevisits. What tests might I have during prenatal care visits? You may have blood, urine, and imaging tests throughout your pregnancy, such as: Urine tests to check for glucose, protein, or signs of infection. Glucose tests to check for a form of diabetes that can develop during pregnancy (gestational diabetes mellitus). This is usually done around week 24 of pregnancy. Ultrasounds to check your baby's growth and development, to check for birth defects, and to check your baby's well-being. These can also help to decide when you should deliver your baby. A test to check for group B strep (GBS) infection. This is usually done around week 36 of pregnancy. Genetic testing. This may include blood, fluid, or tissue sampling, or imaging tests, such as an ultrasound. Some genetic tests are done during the first trimester and some are done during the second trimester. What else can I expect during prenatal care visits? Your health care provider may recommend getting certain vaccines during pregnancy. These may include: A yearly flu shot (annual influenza vaccine). This is especially important if you will be pregnant during flu season. Tdap (tetanus, diphtheria, pertussis) vaccine. Getting this vaccine during pregnancy can protect your baby from whooping cough (pertussis) after birth. This vaccine may be recommended between weeks 27 and 36 of pregnancy. A COVID-19  vaccine. Later in your pregnancy, your health care provider may give you information about: Childbirth and breastfeeding classes. Choosing a health care provider for your baby. Umbilical cord banking. Breastfeeding. Birth control after your baby is born. The hospital labor and delivery unit and how to set up a tour. Registering at the hospital before you go into labor. Where to find more information Office on Women's Health: womenshealth.gov American Pregnancy Association: americanpregnancy.org March of Dimes: marchofdimes.org Summary Prenatal care helps you and your baby stay as healthy as possible during pregnancy. Your first prenatal care visit will most likely be the longest. You will have visits and tests throughout your pregnancy to monitor your health and your baby's health. Bring a list of questions to your visits to ask your health care provider. Make sure to keep all follow-up and prenatal care visits. This information is not intended to replace advice given to you by your health care provider. Make sure you discuss any questions you have with your healthcare provider. Document Revised: 11/21/2019 Document Reviewed: 11/21/2019 Elsevier Patient Education  2022 [employer]  

## 2020-10-20 NOTE — Addendum Note (Signed)
Addended by: Mechele Claude on: 10/20/2020 08:37 AM   Modules accepted: Orders

## 2020-10-20 NOTE — Progress Notes (Signed)
NEW OB HISTORY AND PHYSICAL  SUBJECTIVE:       Shirley Hayes is a 25 y.o. [id number] female, Patient's last menstrual period was 08/05/2020 (approximate)., Estimated Date of Delivery: 05/12/21, [redacted]w[redacted]d, presents today for establishment of Prenatal Care. She has no unusual complaints .  Body mass index is 32.31 kg/m.     Gynecologic History Patient's last menstrual period was 08/05/2020 (approximate). Normal Contraception: none Last Pap: unsure .   Obstetric History OB History  Gravida Para Term Preterm AB Living  4 3 3     3   SAB IAB Ectopic Multiple Live Births        0 3    # Outcome Date GA Lbr Len/2nd Weight Sex Delivery Anes PTL Lv  4 Current           3 Term 09/03/15 [redacted]w[redacted]d  8 lb 13.1 oz (4 kg) M CS-LTranv Spinal  LIV  2 Term 2015   7 lb 11.2 oz (3.493 kg) M CS-LTranv   LIV  1 Term 2012   7 lb 2.1 oz (3.234 kg) M Vag-Spont   LIV    Obstetric Comments  1st Menstrual Cycle:  10  1st Pregnancy:  13    Past Medical History:  Diagnosis Date   Anxiety    Asthma    physical induced asthma   Depression    Diabetes mellitus without complication (HCC)    GERD (gastroesophageal reflux disease)    Gestational diabetes    Hypothyroidism    Mental disorder     Past Surgical History:  Procedure Laterality Date   CESAREAN SECTION  [phone]   CESAREAN SECTION N/A 09/03/2015   Procedure: CESAREAN SECTION;  Surgeon: 09/05/2015, MD;  Location: [facility] ORS;  Service: Obstetrics;  Laterality: N/A;   CHOLECYSTECTOMY N/A 01/01/2015   Procedure: LAPAROSCOPIC CHOLECYSTECTOMY WITH INTRAOPERATIVE CHOLANGIOGRAM, laparoscopic incisional hernia repair;  Surgeon: 13/11/2014, MD;  Location: [facility] ORS;  Service: General;  Laterality: N/A;   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 Day 1 &2: 2  tablet at bedtimeDay 3 : if symptoms persists 1 tablet am; 2 tablet at bedtimeDay 4: 1 tablet am, 1 tab afternoon, 2 tab at bedtime 120 tablet 5   FLUoxetine (PROZAC) 20 MG capsule Take 1 capsule by mouth daily.     prenatal vitamin w/FE, FA (NATACHEW) 29-1 MG CHEW chewable tablet Chew 1 tablet by mouth daily at 12 noon.     No current facility-administered medications on file prior to visit.    Allergies  Allergen Reactions   Chlorhexidine Gluconate Itching    "severe"   Tape Rash    Social History   Socioeconomic History   Marital status: Married    Spouse name: Not on file   Number of children: Not on file   Years of education: Not on file   Highest education level: Not on file  Occupational History   Not on file  Tobacco Use   Smoking status: Former    Packs/day: 1.00    Years: 3.00    Pack years: 3.00    Types: Cigarettes    Quit date: 07/22/2013    Years since quitting: 7.2   Smokeless tobacco: Never  Vaping Use   Vaping Use: Never used  Substance and Sexual Activity   Alcohol  use: No    Alcohol/week: 0.0 standard drinks   Drug use: No   Sexual activity: Yes    Birth control/protection: None  Other Topics Concern   Not on file  Social History Narrative   Not on file   Social Determinants of Health   Financial Resource Strain: Not on file  Food Insecurity: Not on file  Transportation Needs: Not on file  Physical Activity: Not on file  Stress: Not on file  Social Connections: Not on file  Intimate Partner Violence: Not on file    Family History  Problem Relation Age of Onset   Diabetes Mother    Hypertension Mother    Hypertension Sister    Hypertension Maternal Grandmother    Hypertension Other    Breast cancer Neg Hx    Ovarian cancer Neg Hx    Colon cancer Neg Hx     The following portions of the patient's history were reviewed and updated as appropriate: allergies, current medications, past OB history, past medical history, past surgical  history, past family history, past social history, and problem list.    OBJECTIVE: Initial Physical Exam (New OB)  GENERAL APPEARANCE: alert, well appearing, in no apparent distress, oriented to person, place and time HEAD: normocephalic, atraumatic MOUTH: mucous membranes moist, pharynx normal without lesions THYROID: no thyromegaly or masses present BREASTS: no masses noted, no significant tenderness, no palpable axillary nodes, no skin changes LUNGS: clear to auscultation, no wheezes, rales or rhonchi, symmetric air entry HEART: regular rate and rhythm, no murmurs ABDOMEN: soft, nontender, nondistended, no abnormal masses, no epigastric pain, obese, and to early to listen to [facility] EXTREMITIES: no redness or tenderness in the calves or thighs, no edema, no limitation in range of motion, intact peripheral pulses SKIN: normal coloration and turgor, no rashes LYMPH NODES: no adenopathy palpable NEUROLOGIC: alert, oriented, normal speech, no focal findings or movement disorder noted  PELVIC EXAM EXTERNAL GENITALIA: normal appearing vulva with no masses, tenderness or lesions VAGINA: no abnormal discharge or lesions CERVIX: no lesions or cervical motion tenderness, pap collected, contact bleeding UTERUS: gravid ADNEXA: no masses palpable and nontender OB EXAM PELVIMETRY: appears adequate RECTUM: exam not indicated  ASSESSMENT: Normal pregnancy  PLAN: Prenatal care See ordersNew OB counseling: The patient has been given an overview regarding routine prenatal care. Recommendations regarding diet, weight gain, and exercise in pregnancy were given. Prenatal testing, optional genetic testing, carrier screening and ultrasound use in pregnancy were reviewed. Benefits of Breast Feeding were discussed. The patient is encouraged to consider nursing her baby post partum.  Follow up 2 wk for [facility] and genetic testing.   Doreene Burke, CNM

## 2020-10-21 LAB — CYTOLOGY - PAP: Diagnosis: NEGATIVE

## 2020-10-29 LAB — PAIN MGT SCRN (14 DRUGS), UR
Amphetamine Scrn, Ur: NEGATIVE ng/mL
BARBITURATE SCREEN URINE: NEGATIVE ng/mL
BENZODIAZEPINE SCREEN, URINE: NEGATIVE ng/mL
Buprenorphine, Urine: NEGATIVE ng/mL
CANNABINOIDS UR QL SCN: NEGATIVE ng/mL
Cocaine (Metab) Scrn, Ur: NEGATIVE ng/mL
Creatinine(Crt), U: 201.2 mg/dL (ref 20.0–300.0)
Fentanyl, Urine: NEGATIVE pg/mL
Meperidine Screen, Urine: NEGATIVE ng/mL
Methadone Screen, Urine: NEGATIVE ng/mL
OXYCODONE+OXYMORPHONE UR QL SCN: NEGATIVE ng/mL
Opiate Scrn, Ur: NEGATIVE ng/mL
Ph of Urine: 5.5 (ref 4.5–8.9)
Phencyclidine Qn, Ur: NEGATIVE ng/mL
Propoxyphene Scrn, Ur: NEGATIVE ng/mL
Tramadol Screen, Urine: NEGATIVE ng/mL

## 2020-10-29 LAB — SPECIMEN STATUS REPORT

## 2020-11-03 ENCOUNTER — Ambulatory Visit (INDEPENDENT_AMBULATORY_CARE_PROVIDER_SITE_OTHER): Payer: Medicaid Other | Admitting: Certified Nurse Midwife

## 2020-11-03 ENCOUNTER — Other Ambulatory Visit: Payer: Self-pay

## 2020-11-03 ENCOUNTER — Encounter: Payer: Self-pay | Admitting: Certified Nurse Midwife

## 2020-11-03 VITALS — BP 123/73 | HR 91 | Wt 204.4 lb

## 2020-11-03 DIAGNOSIS — Z3A13 13 weeks gestation of pregnancy: Secondary | ICD-10-CM

## 2020-11-03 DIAGNOSIS — Z3481 Encounter for supervision of other normal pregnancy, first trimester: Secondary | ICD-10-CM

## 2020-11-03 LAB — POCT URINALYSIS DIPSTICK OB
Bilirubin, UA: NEGATIVE
Blood, UA: NEGATIVE
Glucose, UA: NEGATIVE
Ketones, UA: 15
Leukocytes, UA: NEGATIVE
Nitrite, UA: NEGATIVE
Odor: NEGATIVE
POC,PROTEIN,UA: NEGATIVE
Spec Grav, UA: 1.02 (ref 1.010–1.025)
Urobilinogen, UA: 0.2 E.U./dL
pH, UA: 6 (ref 5.0–8.0)

## 2020-11-03 MED ORDER — FLUOXETINE HCL 20 MG PO CAPS: 20.0000 mg | ORAL_CAPSULE | Freq: Every day | ORAL | 9 refills | Status: DC

## 2020-11-03 NOTE — Progress Notes (Signed)
ROB- FHT today.  Maternit21- done. Possible wean off Prozac.  PHQ today -9.

## 2020-11-03 NOTE — Progress Notes (Signed)
ROB for fetal heart tones , unable to hear at NOB due to gestational age. Discussed use of Prozac in pregnancy , risks and benefits. Mother to baby fact sheet given. She would like to continue with use in pregnancy. Maternit 21 and CBC today. Will start Asprin at 14 wks.  Follow up ROB in 4 wks.   Doreene Burke, CNM

## 2020-11-04 LAB — CBC
Hematocrit: 36.5 % (ref 34.0–46.6)
Hemoglobin: 12.3 g/dL (ref 11.1–15.9)
MCH: 31.5 pg (ref 26.6–33.0)
MCHC: 33.7 g/dL (ref 31.5–35.7)
MCV: 94 fL (ref 79–97)
Platelets: 150 x10E3/uL (ref 150–450)
RBC: 3.9 x10E6/uL (ref 3.77–5.28)
RDW: 12.4 % (ref 11.7–15.4)
WBC: 4.9 x10E3/uL (ref 3.4–10.8)

## 2020-11-07 LAB — MATERNIT21  PLUS CORE+ESS+SCA, BLOOD

## 2020-11-13 ENCOUNTER — Other Ambulatory Visit: Payer: Self-pay | Admitting: Certified Nurse Midwife

## 2020-11-13 DIAGNOSIS — Z9889 Other specified postprocedural states: Secondary | ICD-10-CM

## 2020-11-13 DIAGNOSIS — O0990 Supervision of high risk pregnancy, unspecified, unspecified trimester: Secondary | ICD-10-CM

## 2020-11-21 ENCOUNTER — Emergency Department
Admission: EM | Admit: 2020-11-21 | Discharge: 2020-11-21 | Disposition: A | Discharge status: Home / Self Care | Payer: Medicaid Other | Attending: Emergency Medicine | Admitting: Emergency Medicine

## 2020-11-21 ENCOUNTER — Emergency Department: Payer: Medicaid Other

## 2020-11-21 ENCOUNTER — Encounter: Payer: Self-pay | Admitting: Intensive Care

## 2020-11-21 ENCOUNTER — Other Ambulatory Visit: Payer: Self-pay

## 2020-11-21 DIAGNOSIS — E119 Type 2 diabetes mellitus without complications: Secondary | ICD-10-CM | POA: Insufficient documentation

## 2020-11-21 DIAGNOSIS — Z3492 Encounter for supervision of normal pregnancy, unspecified, second trimester: Secondary | ICD-10-CM

## 2020-11-21 DIAGNOSIS — R109 Unspecified abdominal pain: Secondary | ICD-10-CM | POA: Diagnosis not present

## 2020-11-21 DIAGNOSIS — R1032 Left lower quadrant pain: Secondary | ICD-10-CM

## 2020-11-21 DIAGNOSIS — Z87891 Personal history of nicotine dependence: Secondary | ICD-10-CM | POA: Insufficient documentation

## 2020-11-21 DIAGNOSIS — J45909 Unspecified asthma, uncomplicated: Secondary | ICD-10-CM | POA: Diagnosis not present

## 2020-11-21 DIAGNOSIS — O26899 Other specified pregnancy related conditions, unspecified trimester: Secondary | ICD-10-CM

## 2020-11-21 DIAGNOSIS — Z3A17 17 weeks gestation of pregnancy: Secondary | ICD-10-CM | POA: Diagnosis not present

## 2020-11-21 DIAGNOSIS — R102 Pelvic and perineal pain: Secondary | ICD-10-CM

## 2020-11-21 DIAGNOSIS — N3090 Cystitis, unspecified without hematuria: Secondary | ICD-10-CM

## 2020-11-21 DIAGNOSIS — Z79899 Other long term (current) drug therapy: Secondary | ICD-10-CM | POA: Insufficient documentation

## 2020-11-21 DIAGNOSIS — E039 Hypothyroidism, unspecified: Secondary | ICD-10-CM | POA: Insufficient documentation

## 2020-11-21 DIAGNOSIS — O2312 Infections of bladder in pregnancy, second trimester: Secondary | ICD-10-CM | POA: Insufficient documentation

## 2020-11-21 LAB — URINALYSIS, COMPLETE (UACMP) WITH MICROSCOPIC
Bilirubin Urine: NEGATIVE
Glucose, UA: NEGATIVE mg/dL
Hgb urine dipstick: NEGATIVE
Ketones, ur: 5 mg/dL — AB
Nitrite: NEGATIVE
Protein, ur: 30 mg/dL — AB
Specific Gravity, Urine: 1.031 — ABNORMAL HIGH (ref 1.005–1.030)
WBC, UA: >50 WBC/hpf (ref 0–5)
pH: 5 (ref 5.0–8.0)

## 2020-11-21 LAB — CBC
HCT: 33.3 % — ABNORMAL LOW (ref 36.0–46.0)
Hemoglobin: 12.1 g/dL (ref 12.0–15.0)
MCH: 33.2 pg (ref 26.0–34.0)
MCHC: 36.3 g/dL — ABNORMAL HIGH (ref 30.0–36.0)
MCV: 91.5 fL (ref 80.0–100.0)
Platelets: 160 10*3/uL (ref 150–400)
RBC: 3.64 MIL/uL — ABNORMAL LOW (ref 3.87–5.11)
RDW: 13.2 % (ref 11.5–15.5)
WBC: 5.6 10*3/uL (ref 4.0–10.5)
nRBC: 0 % (ref 0.0–0.2)

## 2020-11-21 LAB — COMPREHENSIVE METABOLIC PANEL
ALT: 25 U/L (ref 0–44)
AST: 25 U/L (ref 15–41)
Albumin: 3.3 g/dL — ABNORMAL LOW (ref 3.5–5.0)
Alkaline Phosphatase: 117 U/L (ref 38–126)
Anion gap: 7 (ref 5–15)
BUN: 15 mg/dL (ref 6–20)
CO2: 22 mmol/L (ref 22–32)
Calcium: 9 mg/dL (ref 8.9–10.3)
Chloride: 108 mmol/L (ref 98–111)
Creatinine, Ser: 0.51 mg/dL (ref 0.44–1.00)
GFR, Estimated: >60 mL/min (ref >60)
Glucose, Bld: 86 mg/dL (ref 70–99)
Potassium: 3.9 mmol/L (ref 3.5–5.1)
Sodium: 137 mmol/L (ref 135–145)
Total Bilirubin: 0.6 mg/dL (ref 0.3–1.2)
Total Protein: 6.4 g/dL — ABNORMAL LOW (ref 6.5–8.1)

## 2020-11-21 LAB — HCG, QUANTITATIVE, PREGNANCY: hCG, Beta Chain, Quant, S: 70991 m[IU]/mL — ABNORMAL HIGH (ref <5)

## 2020-11-21 LAB — LIPASE, BLOOD: Lipase: 37 U/L (ref 11–51)

## 2020-11-21 MED ORDER — CEPHALEXIN 500 MG PO CAPS: 500.0000 mg | ORAL_CAPSULE | Freq: Three times a day (TID) | ORAL | 0 refills | Status: DC

## 2020-11-21 NOTE — ED Triage Notes (Signed)
Patient reports she is [redacted] weeks pregnant. C/o sharp left sided abdominal pain with nausea. Third episode this week. [facility] seen [facility]. Reports pain happens after eating. HX gastric bypass X1 year ago. Denies bleeding or discharge

## 2020-11-21 NOTE — ED Provider Notes (Signed)
[facility] Emergency Department Provider Note  ____________________________________________  Time seen: Approximately 6:07 PM  I have reviewed the triage vital signs and the nursing notes.   HISTORY  Chief Complaint Abdominal Pain    HPI Shirley Hayes is a 25 y.o. female with a history of diabetes, GERD, anxiety, currently [redacted] weeks pregnant who comes ED complaining of sharp left-sided abdominal pain which radiates to the groin.  Also has urinary frequency.  Pain seems to be worse after eating, denies nausea vomiting diarrhea or constipation.  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  Denies fever or chills.  No vaginal bleeding or discharge.  Pain is intermittent, no alleviating factors.    Past Medical History:  Diagnosis Date   Anxiety    Asthma    physical induced asthma   Depression    Diabetes mellitus without complication (HCC)    GERD (gastroesophageal reflux disease)    Gestational diabetes    Hypothyroidism    Mental disorder      Patient Active Problem List   Diagnosis Date Noted   History of alpha-1-antitrypsin deficiency 10/20/2020   History of gestational diabetes 10/20/2020   History of gastric bypass 10/07/2020   S/P cesarean section 09/03/2015     Past Surgical History:  Procedure Laterality Date   CESAREAN SECTION  [id number]   CESAREAN SECTION N/A 09/03/2015   Procedure: CESAREAN SECTION;  Surgeon: Vena Austria, MD;  Location: [facility] ORS;  Service: Obstetrics;  Laterality: N/A;   CHOLECYSTECTOMY N/A 01/01/2015   Procedure: LAPAROSCOPIC CHOLECYSTECTOMY WITH INTRAOPERATIVE CHOLANGIOGRAM, laparoscopic incisional hernia repair;  Surgeon: Kieth Brightly, MD;  Location: [facility] ORS;  Service: General;  Laterality: N/A;   GASTRIC BYPASS  12/24/2019   LAPAROSCOPY  02/22/2012     Prior to Admission medications   Medication Sig Start Date End Date Taking? Authorizing Provider  cephALEXin (KEFLEX) 500  MG capsule Take 1 capsule (500 mg total) by mouth 3 (three) times daily. 11/21/20  Yes Sharman Cheek, MD  Doxylamine-Pyridoxine 10-10 MG TBEC Take 1 tablet by mouth 4 (four) times daily. Day 1 &2: 2 tablet at bedtimeDay 3 : if symptoms persists 1 tablet am; 2 tablet at bedtimeDay 4: 1 tablet am, 1 tab afternoon, 2 tab at bedtime 10/07/20   Doreene Burke, CNM  FLUoxetine (PROZAC) 20 MG capsule Take 1 capsule (20 mg total) by mouth daily. 11/03/20   Doreene Burke, CNM  prenatal vitamin w/FE, FA (NATACHEW) 29-1 MG CHEW chewable tablet Chew 1 tablet by mouth daily at 12 noon.    [provider]     Allergies Chlorhexidine gluconate and Tape   Family History  Problem Relation Age of Onset   Diabetes Mother    Hypertension Mother    Hypertension Sister    Hypertension Maternal Grandmother    Hypertension Other    Breast cancer Neg Hx    Ovarian cancer Neg Hx    Colon cancer Neg Hx     Social History Social History   Tobacco Use   Smoking status: Former    Packs/day: 1.00    Years: 3.00    Pack years: 3.00    Types: Cigarettes    Quit date: 07/22/2013    Years since quitting: 7.3   Smokeless tobacco: Never  Vaping Use   Vaping Use: Never used  Substance Use Topics   Alcohol use: No    Alcohol/week: 0.0 standard drinks   Drug use: No  Review of Systems  Constitutional:   No fever or chills.  ENT:   No sore throat. No rhinorrhea. Cardiovascular:   No chest pain or syncope. Respiratory:   No dyspnea or cough. Gastrointestinal:   Positive as above for left-sided abdominal pain without vomiting and diarrhea.  Musculoskeletal:   Negative for focal pain or swelling All other systems reviewed and are negative except as documented above in ROS and HPI.  ____________________________________________   PHYSICAL EXAM:  VITAL SIGNS: ED Triage Vitals  Enc Vitals Group     BP 11/21/20 1422 (!) 111/59     Pulse Rate 11/21/20 1422 80     Resp 11/21/20 1422 20      Temp 11/21/20 1422 98 F (36.7 C)     Temp Source 11/21/20 1422 Oral     SpO2 11/21/20 1422 100 %     Weight 11/21/20 1422 (!) 445 lb 5.3 oz (202 kg)     Height 11/21/20 1422 5\' 7"  (1.702 m)     Head Circumference --      Peak Flow --      Pain Score 11/21/20 1427 5     Pain Loc --      Pain Edu? --      Excl. in GC? --     Vital signs reviewed, nursing assessments reviewed.   Constitutional:   Alert and oriented. Non-toxic appearance. Eyes:   Conjunctivae are normal. EOMI. PERRL. ENT      Head:   Normocephalic and atraumatic.      Nose:   Wearing a mask.      Mouth/Throat:   Wearing a mask.      Neck:   No meningismus. Full ROM. Hematological/Lymphatic/Immunilogical:   No cervical lymphadenopathy. Cardiovascular:   RRR. Symmetric bilateral radial and DP pulses.  No murmurs. Cap refill less than 2 seconds. Respiratory:   Normal respiratory effort without tachypnea/retractions. Breath sounds are clear and equal bilaterally. No wheezes/rales/rhonchi. Gastrointestinal:   Soft with left lower quadrant and suprapubic tenderness. Non distended. There is no CVA tenderness.  No rebound, rigidity, or guarding. Genitourinary:   deferred Musculoskeletal:   Normal range of motion in all extremities. No joint effusions.  No lower extremity tenderness.  No edema. Neurologic:   Normal speech and language.  Motor grossly intact. No acute focal neurologic deficits are appreciated.  Skin:    Skin is warm, dry and intact. No rash noted.  No petechiae, purpura, or bullae.  ____________________________________________    LABS (pertinent positives/negatives) (all labs ordered are listed, but only abnormal results are displayed) Labs Reviewed  COMPREHENSIVE METABOLIC PANEL - Abnormal; Notable for the following components:      Result Value   Total Protein 6.4 (*)    Albumin 3.3 (*)    All other components within normal limits  CBC - Abnormal; Notable for the following components:   RBC 3.64  (*)    HCT 33.3 (*)    MCHC 36.3 (*)    All other components within normal limits  URINALYSIS, COMPLETE (UACMP) WITH MICROSCOPIC - Abnormal; Notable for the following components:   Color, Urine AMBER (*)    APPearance CLOUDY (*)    Specific Gravity, Urine 1.031 (*)    Ketones, ur 5 (*)    Protein, ur 30 (*)    Leukocytes,Ua MODERATE (*)    Bacteria, UA RARE (*)    All other components within normal limits  HCG, QUANTITATIVE, PREGNANCY - Abnormal; Notable for the following components:  hCG, Beta Chain, Quant, S 70,991 (*)    All other components within normal limits  URINE CULTURE  LIPASE, BLOOD   ____________________________________________   EKG    ____________________________________________    RADIOLOGY  US OB Limited  Result Date: 11/21/2020 CLINICAL DATA:  Lambert Mody left-sided abdominal pain beginning today. EXAM: LIMITED OBSTETRIC ULTRASOUND COMPARISON:  None. FINDINGS: Number of Fetuses: 1 Heart Rate:  143 bpm Movement: Yes Presentation: Variable Placental Location: Right lateral Previa: No Amniotic Fluid (Subjective):  Within normal limits. BPD: 2.9 cm 15 w  1 d MATERNAL FINDINGS: Cervix:  Appears closed. Uterus/Adnexae: No abnormality visualized. IMPRESSION: Single living IUP at approximately [redacted] weeks gestational age. No evidence of placental abruption or other acute maternal findings. This exam is performed on an emergent basis and does not comprehensively evaluate fetal size, dating, or anatomy; follow-up complete OB US should be considered if further fetal assessment is warranted. Electronically Signed   By: Danae Orleans M.D.   On: 11/21/2020 17:46   US Renal  Result Date: 11/21/2020 CLINICAL DATA:  Left flank pain. EXAM: RENAL / URINARY TRACT ULTRASOUND COMPLETE COMPARISON:  CT abdomen pelvis dated 03/01/2019. FINDINGS: Right Kidney: Renal measurements: 10.4 x 5.5 x 4.9 cm = volume: 144 mL. Echogenicity within normal limits. No mass or hydronephrosis visualized. Left  Kidney: Renal measurements: 10.1 x 6.2 x 5.6 cm = volume: 183 mL. Echogenicity within normal limits. No mass or hydronephrosis visualized. Bladder: Appears normal for degree of bladder distention. Other: None. IMPRESSION: No findings to explain the patient's symptoms.  No hydronephrosis. Electronically Signed   By: Romona Curls M.D.   On: 11/21/2020 18:34    ____________________________________________   PROCEDURES Procedures  ____________________________________________  DIFFERENTIAL DIAGNOSIS   Cystitis, ureterolithiasis, pregnancy complication, functional pain, gastritis  CLINICAL IMPRESSION / ASSESSMENT AND PLAN / ED COURSE  Medications ordered in the ED: Medications - No data to display  Pertinent labs & imaging results that were available during my care of the patient were reviewed by me and considered in my medical decision making (see chart for details).  ATHELENE HURSEY was evaluated in Emergency Department on 11/21/2020 for the symptoms described in the history of present illness. She was evaluated in the context of the global COVID-19 pandemic, which necessitated consideration that the patient might be at risk for infection with the SARS-CoV-2 virus that causes COVID-19. Institutional protocols and algorithms that pertain to the evaluation of patients at risk for COVID-19 are in a state of rapid change based on information released by regulatory bodies including the CDC and federal and state organizations. These policies and algorithms were followed during the patient's care in the ED.   Patient presents with postprandial symptoms with some left-sided abdominal pain and suprapubic tenderness.  Vital unremarkable.  Urinalysis shows evidence of UTI along with some calcium oxalate crystals.  Will obtain ultrasound OB and ultrasound renal to evaluate for pregnancy complication versus evidence of kidney stone with hydronephrosis.  Serum labs are unremarkable.  Would plan to start  antibiotics for uncomplicated UTI if no additional issues are identified.  Clinical Course as of 11/21/20 1908  Sat Nov 21, 2020  1905 Ultrasounds unremarkable, no evidence of hydronephrosis.  UA consistent with UTI, presentation consistent with cystitis.  Presentation not consistent with pyelonephritis.  I will start her on Keflex, plan for outpatient follow-up with [facility]. [PS]    Clinical Course User Index [PS] Sharman Cheek, MD     ____________________________________________   FINAL CLINICAL IMPRESSION(S) / ED DIAGNOSES  Final diagnoses:  Cystitis  Second trimester pregnancy  Left lower quadrant abdominal pain     ED Discharge Orders          Ordered    cephALEXin (KEFLEX) 500 MG capsule  3 times daily        11/21/20 1908            Portions of this note were generated with dragon dictation software. Dictation errors may occur despite best attempts at proofreading.    Sharman Cheek, MD 11/21/20 Windell Moment

## 2020-11-21 NOTE — ED Notes (Signed)
Patient stable and discharged with all personal belongings and AVS. AVS and discharge instructions reviewed with patient and opportunity for questions provided.   

## 2020-11-21 NOTE — Discharge Instructions (Addendum)
Your lab tests show a bladder infection.  Your ultrasounds are normal today.  Take Keflex as prescribed to treat the bladder infection and follow-up with your doctor in a week for recheck.

## 2020-11-23 LAB — URINE CULTURE

## 2020-12-01 ENCOUNTER — Encounter: Payer: Medicaid Other | Admitting: Certified Nurse Midwife

## 2020-12-01 ENCOUNTER — Encounter: Payer: Self-pay | Admitting: Certified Nurse Midwife

## 2020-12-01 DIAGNOSIS — Z3A16 16 weeks gestation of pregnancy: Secondary | ICD-10-CM

## 2020-12-01 DIAGNOSIS — Z3482 Encounter for supervision of other normal pregnancy, second trimester: Secondary | ICD-10-CM

## 2020-12-08 ENCOUNTER — Ambulatory Visit (INDEPENDENT_AMBULATORY_CARE_PROVIDER_SITE_OTHER): Payer: Self-pay | Admitting: Certified Nurse Midwife

## 2020-12-08 DIAGNOSIS — Z3A17 17 weeks gestation of pregnancy: Secondary | ICD-10-CM

## 2020-12-08 DIAGNOSIS — Z3482 Encounter for supervision of other normal pregnancy, second trimester: Secondary | ICD-10-CM

## 2020-12-09 ENCOUNTER — Encounter: Payer: Self-pay | Admitting: Certified Nurse Midwife

## 2021-01-01 ENCOUNTER — Other Ambulatory Visit: Payer: Self-pay

## 2021-01-01 ENCOUNTER — Ambulatory Visit (INDEPENDENT_AMBULATORY_CARE_PROVIDER_SITE_OTHER): Payer: Medicaid Other | Admitting: Certified Nurse Midwife

## 2021-01-01 ENCOUNTER — Encounter: Payer: Self-pay | Admitting: Certified Nurse Midwife

## 2021-01-01 VITALS — BP 108/67 | HR 84 | Wt 207.1 lb

## 2021-01-01 DIAGNOSIS — Z9884 Bariatric surgery status: Secondary | ICD-10-CM

## 2021-01-01 DIAGNOSIS — O0990 Supervision of high risk pregnancy, unspecified, unspecified trimester: Secondary | ICD-10-CM

## 2021-01-01 DIAGNOSIS — Z3A21 21 weeks gestation of pregnancy: Secondary | ICD-10-CM

## 2021-01-01 DIAGNOSIS — E161 Other hypoglycemia: Secondary | ICD-10-CM

## 2021-01-01 DIAGNOSIS — Z3482 Encounter for supervision of other normal pregnancy, second trimester: Secondary | ICD-10-CM

## 2021-01-01 DIAGNOSIS — Z9889 Other specified postprocedural states: Secondary | ICD-10-CM

## 2021-01-01 LAB — POCT URINALYSIS DIPSTICK OB
Bilirubin, UA: NEGATIVE
Blood, UA: NEGATIVE
Glucose, UA: NEGATIVE
Ketones, UA: NEGATIVE
Leukocytes, UA: NEGATIVE
Nitrite, UA: NEGATIVE
POC,PROTEIN,UA: NEGATIVE
Spec Grav, UA: 1.02 (ref 1.010–1.025)
Urobilinogen, UA: 0.2 E.U./dL
pH, UA: 6 (ref 5.0–8.0)

## 2021-01-01 MED ORDER — BLOOD GLUCOSE MONITOR KIT: PACK | 0 refills | Status: DC

## 2021-01-01 NOTE — Progress Notes (Signed)
OB-Pt present for routine prenatal care. Pt stated that she was doing well. Pt stated that she was having round ligament pains and declined flu vaccine.

## 2021-01-01 NOTE — Progress Notes (Signed)
ROB doing well, feeling movement. C/O reactive hypoglycemia. Is requesting glucose meter. Orders placed. Also orders placed for dietary consult. Labs collected today. Discussed u/s , she missed last visit . Orders placed. Follow up 3 wks for ROB.   Body mass index is 32.44 kg/m.   Doreene Burke, CNM

## 2021-01-01 NOTE — Patient Instructions (Signed)

## 2021-01-01 NOTE — Addendum Note (Signed)
Addended by: Silvano Bilis on: 01/01/2021 10:41 AM   Modules accepted: Orders

## 2021-01-06 LAB — VITAMIN B6: Vitamin B6: 9.2 ug/L (ref 3.4–65.2)

## 2021-01-06 LAB — VITAMIN D 25 HYDROXY (VIT D DEFICIENCY, FRACTURES): Vit D, 25-Hydroxy: 30.2 ng/mL (ref 30.0–100.0)

## 2021-01-06 LAB — FERRITIN: Ferritin: 179 ng/mL — ABNORMAL HIGH (ref 15–150)

## 2021-01-06 LAB — VITAMIN B12: Vitamin B-12: 500 pg/mL (ref 232–1245)

## 2021-01-06 LAB — FOLATE: Folate: 13.8 ng/mL (ref >3.0)

## 2021-01-08 ENCOUNTER — Other Ambulatory Visit: Payer: Self-pay

## 2021-01-08 ENCOUNTER — Other Ambulatory Visit: Payer: Self-pay | Admitting: Certified Nurse Midwife

## 2021-01-08 ENCOUNTER — Ambulatory Visit (INDEPENDENT_AMBULATORY_CARE_PROVIDER_SITE_OTHER): Payer: Medicaid Other

## 2021-01-08 DIAGNOSIS — Z3482 Encounter for supervision of other normal pregnancy, second trimester: Secondary | ICD-10-CM | POA: Diagnosis not present

## 2021-01-08 DIAGNOSIS — Z3402 Encounter for supervision of normal first pregnancy, second trimester: Secondary | ICD-10-CM

## 2021-01-20 ENCOUNTER — Ambulatory Visit (INDEPENDENT_AMBULATORY_CARE_PROVIDER_SITE_OTHER): Payer: Medicaid Other | Admitting: Certified Nurse Midwife

## 2021-01-20 ENCOUNTER — Other Ambulatory Visit: Payer: Self-pay

## 2021-01-20 ENCOUNTER — Encounter: Payer: Self-pay | Admitting: Certified Nurse Midwife

## 2021-01-20 VITALS — BP 110/72 | HR 80 | Wt 208.0 lb

## 2021-01-20 DIAGNOSIS — Z3482 Encounter for supervision of other normal pregnancy, second trimester: Secondary | ICD-10-CM

## 2021-01-20 DIAGNOSIS — Z3A24 24 weeks gestation of pregnancy: Secondary | ICD-10-CM

## 2021-01-20 LAB — POCT URINALYSIS DIPSTICK OB
Bilirubin, UA: NEGATIVE
Blood, UA: NEGATIVE
Glucose, UA: NEGATIVE
Ketones, UA: NEGATIVE
Leukocytes, UA: NEGATIVE
Nitrite, UA: NEGATIVE
Spec Grav, UA: 1.015 (ref 1.010–1.025)
Urobilinogen, UA: 0.2 E.U./dL
pH, UA: 7 (ref 5.0–8.0)

## 2021-01-20 MED ORDER — FLUOXETINE HCL 20 MG PO CAPS: 20.0000 mg | ORAL_CAPSULE | Freq: Every day | ORAL | 9 refills | Status: DC

## 2021-01-20 NOTE — Progress Notes (Signed)
ROB doing well, feeling good movement. Anatomy u/s completed 11/18, need additional view, repeat scheduled for 12/1. Discussed glucose screen , pt verbalizes understanding. Guidelines for eating prior to test given. Follow up 4 wks.   Doreene Burke, CNM  Patient Name: Shirley Hayes DOB: 23-Sep-1995 MRN: [id number]  ULTRASOUND REPORT  Location: [facility] Date of Service: 01/08/2021   Indications:Anatomy Ultrasound Findings:  Shirley Hayes intrauterine pregnancy is visualized with FHR at 152 BPM.   Biometrics give an (U/S) Gestational age of [redacted]w[redacted]d and an (U/S) EDD of 05/17/21; this correlates with the clinically established Estimated Date of Delivery: 05/12/21   Fetal presentation is transverse.  EFW: 422g / 15oz. Placenta: posterior. Grade: 0 AFI: subjectively normal.  Anatomic survey is incomplete for SAGITTAL SPINE AND DUCTAL ARCH and normal; Gender - female.    Ovaries are not visualized. Survey of the adnexa demonstrates no adnexal masses.  There is no free peritoneal fluid in the cul de sac.  Impression: 1. [redacted]w[redacted]d Viable Singleton Intrauterine pregnancy by U/S. 2. (U/S) EDD is consistent with Clinically established Estimated Date of Delivery: 05/12/21 . 3. Normal Anatomy Scan  Recommendations: 1.Clinical correlation with the patient's History and Physical Exam. 2.Patient scheduled to return in 2 weeks to complete Anatomy scan.  Sheralyn Boatman  [place]

## 2021-01-20 NOTE — Patient Instructions (Signed)
Oral Glucose Tolerance Test During Pregnancy °Why am I having this test? °The oral glucose tolerance test (OGTT) is done to check how your body processes blood sugar (glucose). This is one of several tests used to diagnose diabetes that develops during pregnancy (gestational diabetes mellitus). Gestational diabetes is a short-term form of diabetes that some women develop while they are pregnant. It usually occurs during the second trimester of pregnancy and goes away after delivery. °Testing, or screening, for gestational diabetes usually occurs at weeks 24-28 of pregnancy. You may have the OGTT test after having a 1-hour glucose screening test if the results from that test indicate that you may have gestational diabetes. This test may also be needed if: °You have a history of gestational diabetes. °There is a history of giving birth to very large babies or of losing pregnancies (having stillbirths). °You have signs and symptoms of diabetes, such as: °Changes in your eyesight. °Tingling or numbness in your hands or feet. °Changes in hunger, thirst, and urination, and these are not explained by your pregnancy. °What is being tested? °This test measures the amount of glucose in your blood at different times during a period of 3 hours. This shows how well your body can process glucose. °What kind of sample is taken? °Blood samples are required for this test. They are usually collected by inserting a needle into a blood vessel. °How do I prepare for this test? °For 3 days before your test, eat normally. Have plenty of carbohydrate-rich foods. °Follow instructions from your health care provider about: °Eating or drinking restrictions on the day of the test. You may be asked not to eat or drink anything other than water (to fast) starting 8-10 hours before the test. °Changing or stopping your regular medicines. Some medicines may interfere with this test. °Tell a health care provider about: °All medicines you are taking,  including vitamins, herbs, eye drops, creams, and over-the-counter medicines. °Any blood disorders you have. °Any surgeries you have had. °Any medical conditions you have. °What happens during the test? °First, your blood glucose will be measured. This is referred to as your fasting blood glucose because you fasted before the test. Then, you will drink a glucose solution that contains a certain amount of glucose. Your blood glucose will be measured again 1, 2, and 3 hours after you drink the solution. °This test takes about 3 hours to complete. You will need to stay at the testing location during this time. During the testing period: °Do not eat or drink anything other than the glucose solution. °Do not exercise. °Do not use any products that contain nicotine or tobacco, such as cigarettes, e-cigarettes, and chewing tobacco. These can affect your test results. If you need help quitting, ask your health care provider. °The testing procedure may vary among health care providers and hospitals. °How are the results reported? °Your results will be reported as milligrams of glucose per deciliter of blood (mg/dL) or millimoles per liter (mmol/L). There is more than one source for screening and diagnosis reference values used to diagnose gestational diabetes. Your health care provider will compare your results to normal values that were established after testing a large group of people (reference values). Reference values may vary among labs and hospitals. For this test (Carpenter-Coustan), reference values are: °Fasting: 95 mg/dL (5.3 mmol/L). °1 hour: 180 mg/dL (10.0 mmol/L). °2 hour: 155 mg/dL (8.6 mmol/L). °3 hour: 140 mg/dL (7.8 mmol/L). °What do the results mean? °Results below the reference values are considered   normal. If two or more of your blood glucose levels are at or above the reference values, you may be diagnosed with gestational diabetes. If only one level is high, your health care provider may suggest  repeat testing or other tests to confirm a diagnosis. °Talk with your health care provider about what your results mean. °Questions to ask your health care provider °Ask your health care provider, or the department that is doing the test: °When will my results be ready? °How will I get my results? °What are my treatment options? °What other tests do I need? °What are my next steps? °Summary °The oral glucose tolerance test (OGTT) is one of several tests used to diagnose diabetes that develops during pregnancy (gestational diabetes mellitus). Gestational diabetes is a short-term form of diabetes that some women develop while they are pregnant. °You may have the OGTT test after having a 1-hour glucose screening test if the results from that test show that you may have gestational diabetes. You may also have this test if you have any symptoms or risk factors for this type of diabetes. °Talk with your health care provider about what your results mean. °This information is not intended to replace advice given to you by your health care provider. Make sure you discuss any questions you have with your health care provider. °Document Revised: 07/18/2019 Document Reviewed: 07/18/2019 °Elsevier Patient Education © 2022 [employer] Inc[employer] ° °

## 2021-01-21 ENCOUNTER — Other Ambulatory Visit: Payer: Self-pay

## 2021-01-21 ENCOUNTER — Ambulatory Visit (INDEPENDENT_AMBULATORY_CARE_PROVIDER_SITE_OTHER): Payer: Medicaid Other

## 2021-01-21 DIAGNOSIS — Z3402 Encounter for supervision of normal first pregnancy, second trimester: Secondary | ICD-10-CM

## 2021-01-22 ENCOUNTER — Other Ambulatory Visit: Payer: Medicaid Other

## 2021-01-22 ENCOUNTER — Other Ambulatory Visit: Payer: Self-pay

## 2021-01-22 MED ORDER — ACCU-CHEK SOFTCLIX LANCETS MISC: 1.0000 | Freq: Four times a day (QID) | 12 refills | Status: DC

## 2021-01-22 MED ORDER — ACCU-CHEK SMARTVIEW VI STRP: ORAL_STRIP | 12 refills | Status: DC

## 2021-01-22 MED ORDER — ACCU-CHEK NANO SMARTVIEW W/DEVICE KIT: 1.0000 | PACK | 0 refills | Status: DC

## 2021-02-03 ENCOUNTER — Telehealth: Payer: Self-pay | Admitting: Certified Nurse Midwife

## 2021-02-03 MED ORDER — OSELTAMIVIR PHOSPHATE 75 MG PO CAPS: 75.0000 mg | ORAL_CAPSULE | Freq: Every day | ORAL | 0 refills | Status: DC

## 2021-02-03 NOTE — Telephone Encounter (Signed)
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   Pharm:  [facility] [address]

## 2021-02-11 ENCOUNTER — Other Ambulatory Visit: Payer: Self-pay

## 2021-02-11 ENCOUNTER — Encounter: Payer: Self-pay | Admitting: Obstetrics and Gynecology

## 2021-02-11 ENCOUNTER — Encounter: Payer: Self-pay | Admitting: Certified Nurse Midwife

## 2021-02-11 ENCOUNTER — Observation Stay
Admission: EM | Admit: 2021-02-11 | Discharge: 2021-02-11 | Disposition: A | Discharge status: Home / Self Care | Payer: Medicaid Other | Attending: Obstetrics and Gynecology | Admitting: Obstetrics and Gynecology

## 2021-02-11 DIAGNOSIS — O368320 Maternal care for abnormalities of the fetal heart rate or rhythm, second trimester, not applicable or unspecified: Secondary | ICD-10-CM | POA: Diagnosis not present

## 2021-02-11 DIAGNOSIS — Z3A27 27 weeks gestation of pregnancy: Secondary | ICD-10-CM | POA: Diagnosis not present

## 2021-02-11 DIAGNOSIS — O4692 Antepartum hemorrhage, unspecified, second trimester: Secondary | ICD-10-CM | POA: Diagnosis not present

## 2021-02-11 DIAGNOSIS — O26892 Other specified pregnancy related conditions, second trimester: Secondary | ICD-10-CM | POA: Diagnosis not present

## 2021-02-11 MED ORDER — DOCUSATE SODIUM 100 MG PO CAPS: 100.0000 mg | ORAL_CAPSULE | Freq: Once | ORAL | Status: DC

## 2021-02-11 NOTE — Progress Notes (Signed)
Pt given discharge instructions including follow up care and precautions. Pt reassured.

## 2021-02-11 NOTE — OB Triage Note (Signed)
Pt presents for vaginal bleeding that started around 1030 this morning. Pt reports intercourse last night. Pt showed RN picture of toilet paper that had small amount of light pink tinged blood and scant pink tinged blood in the toilet. +FM. Denies LOF/CTX. VSS. Monitors applied.

## 2021-02-14 NOTE — Discharge Summary (Signed)
° ° °  L&D OB Triage Note  SUBJECTIVE Shirley Hayes is a 25 y.o. [phone] female at [redacted]w[redacted]d, EDD Estimated Date of Delivery: 05/12/21 who presented to triage with complaints of pink discharge noted on toilet paper after wiping.  Patient concern for vaginal bleeding in pregnancy.  She did have intercourse the evening before bleeding noted.  OB History  Gravida Para Term Preterm AB Living  4 3 3  0 0 3  SAB IAB Ectopic Multiple Live Births  0 0 0 0 3    # Outcome Date GA Lbr Len/2nd Weight Sex Delivery Anes PTL Lv  4 Current           3 Term 09/03/15 [redacted]w[redacted]d  4000 g M CS-LTranv Spinal  LIV     Name: Cueto-CARICO,BOY Makhia     Apgar1: 9  Apgar5: 9  2 Term 2015   3493 g M CS-LTranv   LIV  1 Term 2012   3234 g M Vag-Spont   LIV    Obstetric Comments  1st Menstrual Cycle:  10  1st Pregnancy:  13    No medications prior to admission.     OBJECTIVE  Nursing Evaluation:   BP (!) 108/47 (BP Location: Left Arm)    Pulse (!) 54    Temp 98.4 F (36.9 C) (Oral)    Resp 17    Ht 5\' 7"  (1.702 m)    Wt 95 kg    LMP 08/05/2020 (Approximate)    BMI 32.80 kg/m    Findings:        No evidence of preterm labor or other maternal-fetal abnormality       NST was performed and has been reviewed by me.  NST INTERPRETATION: Category I  Mode: External Baseline Rate (A): 135 bpm Variability: Moderate Accelerations: 15 x 15 Decelerations: None     Contraction Frequency (min): none noted  ASSESSMENT Impression:  1.  Pregnancy:  at [redacted]w[redacted]d , EDD Estimated Date of Delivery: 05/12/21 2.  Reassuring fetal and maternal status  PLAN 1. Current condition and above findings reviewed.  Reassuring fetal and maternal condition. 2. Discharge home with standard labor precautions given to return to L&D or call the office for problems. 3. Continue routine prenatal care.

## 2021-02-16 ENCOUNTER — Other Ambulatory Visit: Payer: Self-pay

## 2021-02-16 ENCOUNTER — Ambulatory Visit (INDEPENDENT_AMBULATORY_CARE_PROVIDER_SITE_OTHER): Payer: Medicaid Other | Admitting: Certified Nurse Midwife

## 2021-02-16 ENCOUNTER — Other Ambulatory Visit: Payer: Medicaid Other

## 2021-02-16 ENCOUNTER — Encounter: Payer: Self-pay | Admitting: Certified Nurse Midwife

## 2021-02-16 DIAGNOSIS — Z3483 Encounter for supervision of other normal pregnancy, third trimester: Secondary | ICD-10-CM

## 2021-02-16 DIAGNOSIS — Z23 Encounter for immunization: Secondary | ICD-10-CM

## 2021-02-16 DIAGNOSIS — Z3A28 28 weeks gestation of pregnancy: Secondary | ICD-10-CM

## 2021-02-16 LAB — POCT URINALYSIS DIPSTICK OB
Glucose, UA: NEGATIVE
Ketones, UA: NEGATIVE
Leukocytes, UA: NEGATIVE
Nitrite, UA: NEGATIVE
Spec Grav, UA: >=1.03 — AB (ref 1.010–1.025)
Urobilinogen, UA: 0.2 E.U./dL
pH, UA: 6 (ref 5.0–8.0)

## 2021-02-16 MED ORDER — TETANUS-DIPHTH-ACELL PERTUSSIS 5-2.5-18.5 LF-MCG/0.5 IM SUSY
0.5000 mL | PREFILLED_SYRINGE | Freq: Once | INTRAMUSCULAR | Status: AC
  Administered 2021-02-16: 11:00:00 0.5 mL via INTRAMUSCULAR

## 2021-02-16 NOTE — Progress Notes (Signed)
ROB doing well. Feels good movement. 28 wk labs today: Blood sugar log completed in place of glucola due to hx of gastric bypass surgery. Fasting with in normal / 2 hr PP in normal limits. Pt is not diabetic. Instructed that she does not need to continue to monitor. Log scanned to chart. Labs RPR/CBC. Tdap done, Blood transfusion consent completed, all questions answered. Ready set baby reviewed, see check list for topics covered. Sample birth plan given, will follow up in upcoming visits. Discussed birth control after delivery, information pamphlet given.   Follow up 2 wk with Missy for ROB & U/s growth ( due to hx gastric bypass) or sooner if needed.    Doreene Burke, CNM

## 2021-02-16 NOTE — Patient Instructions (Signed)
Td (Tetanus, Diphtheria) Vaccine: What You Need to Know 1. Why get vaccinated? Td vaccine can prevent tetanus and diphtheria. Tetanus enters the body through cuts or wounds. Diphtheria spreads from person to person. TETANUS (T) causes painful stiffening of the muscles. Tetanus can lead to serious health problems, including being unable to open the mouth, having trouble swallowing and breathing, or death. DIPHTHERIA (D) can lead to difficulty breathing, heart failure, paralysis, or death. 2. Td vaccine Td is only for children 7 years and older, adolescents, and adults.  Td is usually given as a booster dose every 10 years, or after 5 years in the case of a severe or dirty wound or burn. Another vaccine, called "Tdap," may be used instead of Td. Tdap protects against pertussis, also known as "whooping cough," in addition to tetanus anddiphtheria. Td may be given at the same time as other vaccines. 3. Talk with your health care provider Tell your vaccination provider if the person getting the vaccine: Has had an allergic reaction after a previous dose of any vaccine that protects against tetanus or diphtheria, or has any severe, life-threatening allergies Has ever had Guillain-Barr Syndrome (also called "GBS") Has had severe pain or swelling after a previous dose of any vaccine that protects against tetanus or diphtheria In some cases, your health care provider may decide to postpone Td vaccinationuntil a future visit. People with minor illnesses, such as a cold, may be vaccinated. People who are moderately or severely ill should usually wait until they recover beforegetting Td vaccine.  Your health care provider can give you more information. 4. Risks of a vaccine reaction Pain, redness, or swelling where the shot was given, mild fever, headache, feeling tired, and nausea, vomiting, diarrhea, or stomachache sometimes happen after Td vaccination. People sometimes faint after medical procedures,  including vaccination. Tellyour provider if you feel dizzy or have vision changes or ringing in the ears.  As with any medicine, there is a very remote chance of a vaccine causing asevere allergic reaction, other serious injury, or death. 5. What if there is a serious problem? An allergic reaction could occur after the vaccinated person leaves the clinic. If you see signs of a severe allergic reaction (hives, swelling of the face and throat, difficulty breathing, a fast heartbeat, dizziness, or weakness), call 9-1-1and get the person to the nearest hospital.  For other signs that concern you, call your health care provider.  Adverse reactions should be reported to the Vaccine Adverse Event Reporting System (VAERS). Your health care provider will usually file this report, or you can do it yourself. Visit the VAERS website at www.vaers.hhs.gov or call 1-800-822-7967. VAERS is only for reporting reactions, and VAERS staff members do not give medical advice. 6. The National Vaccine Injury Compensation Program The National Vaccine Injury Compensation Program (VICP) is a federal program that was created to compensate people who may have been injured by certain vaccines. Claims regarding alleged injury or death due to vaccination have a time limit for filing, which may be as short as two years. Visit the VICP website at www.hrsa.gov/vaccinecompensation or call 1-800-338-2382to learn about the program and about filing a claim. 7. How can I learn more? Ask your health care provider. Call your local or state health department. Visit the website of the Food and Drug Administration (FDA) for vaccine package inserts and additional information at www.fda.gov/vaccines-blood-biologics/vaccines. Contact the Centers for Disease Control and Prevention (CDC): Call 1-800-232-4636 (1-800-CDC-INFO) or Visit CDC's website at www.cdc.gov/vaccines. Vaccine Information Statement   Td (Tetanus, Diphtheria) Vaccine (09/27/2019) This  information is not intended to replace advice given to you by your health care provider. Make sure you discuss any questions you have with your healthcare provider. Document Revised: 11/14/2019 Document Reviewed: 11/14/2019 Elsevier Patient Education  2022 Elsevier Inc.  

## 2021-02-17 LAB — CBC
Hematocrit: 34.3 % (ref 34.0–46.6)
Hemoglobin: 11.5 g/dL (ref 11.1–15.9)
MCH: 32.9 pg (ref 26.6–33.0)
MCHC: 33.5 g/dL (ref 31.5–35.7)
MCV: 98 fL — ABNORMAL HIGH (ref 79–97)
Platelets: 160 10*3/uL (ref 150–450)
RBC: 3.5 x10E6/uL — ABNORMAL LOW (ref 3.77–5.28)
RDW: 12.2 % (ref 11.7–15.4)
WBC: 5.3 10*3/uL (ref 3.4–10.8)

## 2021-02-17 LAB — RPR: RPR Ser Ql: NONREACTIVE

## 2021-02-21 NOTE — L&D Delivery Note (Signed)
Delivery Note  ? ?ADLEAN HARDEMAN is a 26 y.o. [id number] at [redacted]w[redacted]d Estimated Date of Delivery: 05/12/21. She presented to L&D for an induction of labor for h/o gastric bypass. ? ?PRE-OPERATIVE DIAGNOSIS:  ?1) [redacted]w[redacted]d pregnancy.  ? ?POST-OPERATIVE DIAGNOSIS:  ?1) [redacted]w[redacted]d pregnancy s/p VBAC, Spontaneous  ? ?Delivery Type: VBAC, Spontaneous   ? ?Delivery Anesthesia: Epidural  ? ?Labor Complications:  None ?  ?ESTIMATED BLOOD LOSS: 150  ml   ?FINDINGS:   ?1) female infant, Apgar scores of 8   at 1 minute and 9   at 5 minutes. Birthweight pending.  ? ?SPECIMENS:  ? PLACENTA: ?  Appearance: Intact , large clot noted ?  Removal: Spontaneous    ?  Disposition:  Per protocol ? CORD BLOOD: Collected ? ?DISPOSITION:  ?Infant to left in stable condition in the delivery room, with L&D personnel and mother ? ?NARRATIVE SUMMARY: ?Labor course:  BREENA BEVACQUA is a [id number] at [redacted]w[redacted]d who presented to Labor & Delivery for induction of labor. Her initial cervical exam was 3/60/-2. Labor proceeded with Pitocin and she was found to be completely dilated at 1920. ?With excellent maternal pushing effort, she birthed a viable female infant at 1. The shoulders were birthed without difficulty. The infant was placed skin-to-skin with Tanza. The cord was doubly clamped and cut when pulsations ceased. ?The placenta delivered spontaneously and was noted to be intact with a 3VC. ?A perineal and vaginal examination was performed.  ?Lacerations:  Bilateral labial ?Lacerations were repaired with Vicryl suture using local and epidural anesthesia. ?The patient tolerated this well. Mother and baby were left in stable condition. ?This birth was proctored by Dr. Logan Bores. ? ?Guadlupe Spanish, CNM ?05/05/2021 ?8:48 PM  ?

## 2021-03-04 ENCOUNTER — Ambulatory Visit (INDEPENDENT_AMBULATORY_CARE_PROVIDER_SITE_OTHER): Payer: Medicaid Other | Admitting: Obstetrics

## 2021-03-04 ENCOUNTER — Ambulatory Visit (INDEPENDENT_AMBULATORY_CARE_PROVIDER_SITE_OTHER): Payer: Medicaid Other

## 2021-03-04 ENCOUNTER — Other Ambulatory Visit: Payer: Self-pay

## 2021-03-04 VITALS — BP 111/71 | HR 61 | Wt 205.4 lb

## 2021-03-04 DIAGNOSIS — Z3A28 28 weeks gestation of pregnancy: Secondary | ICD-10-CM | POA: Diagnosis not present

## 2021-03-04 DIAGNOSIS — Z3483 Encounter for supervision of other normal pregnancy, third trimester: Secondary | ICD-10-CM

## 2021-03-04 DIAGNOSIS — F32A Depression, unspecified: Secondary | ICD-10-CM

## 2021-03-04 DIAGNOSIS — O99343 Other mental disorders complicating pregnancy, third trimester: Secondary | ICD-10-CM

## 2021-03-04 DIAGNOSIS — Z9884 Bariatric surgery status: Secondary | ICD-10-CM

## 2021-03-04 LAB — POCT URINALYSIS DIPSTICK OB
Bilirubin, UA: NEGATIVE
Blood, UA: NEGATIVE
Glucose, UA: NEGATIVE
Ketones, UA: NEGATIVE
Leukocytes, UA: NEGATIVE
Nitrite, UA: NEGATIVE
POC,PROTEIN,UA: NEGATIVE
Spec Grav, UA: 1.015 (ref 1.010–1.025)
Urobilinogen, UA: 0.2 E.U./dL
pH, UA: 6 (ref 5.0–8.0)

## 2021-03-04 NOTE — Progress Notes (Signed)
ROB at [redacted]w[redacted]d. Shirley Hayes is feeling well and feels good fetal movement. Growth scan this AM. Per preliminary report, weight is in the 27th percentile and AC in the 2nd percentile. There has been no change in weight since previous scan. Shirley Hayes has lost weight since her previous visit; she is now 1 lb below her pre-pregnancy weight. Consulted with Dr. Marcelline Mates, who recommended protein shakes, increasing carbohydrates, and repeat growth scan in 2-3 weeks. Shirley Hayes is already drinking protein shakes but can add some carbohydrates into her diet. We reviewed keeping a diet log to keep track of what she is eating and notice deficits. Offered referral to dietician, but she declined due to her work schedule. Not taking ASA per recommendation of her surgeon. She is feeling overwhelmed and would like to initiate therapy. Referral to behavioral health placed.  Labs today: Iron, ferritin, Vit D, B12, folate, calcium. Knows to call for decreased fetal movement, contractions, LOF, bleeding. RTC in 2 weeks for [facility] consult with MD. Growth scan in 2-3 weeks.  Lloyd Huger, CNM

## 2021-03-05 ENCOUNTER — Other Ambulatory Visit: Payer: Self-pay | Admitting: Obstetrics

## 2021-03-05 DIAGNOSIS — F419 Anxiety disorder, unspecified: Secondary | ICD-10-CM

## 2021-03-07 LAB — VITAMIN D 25 HYDROXY (VIT D DEFICIENCY, FRACTURES): Vit D, 25-Hydroxy: 34.8 ng/mL (ref 30.0–100.0)

## 2021-03-07 LAB — IRON,TIBC AND FERRITIN PANEL
Ferritin: 191 ng/mL — ABNORMAL HIGH (ref 15–150)
Iron Saturation: 49 % (ref 15–55)
Iron: 156 ug/dL (ref 27–159)
Total Iron Binding Capacity: 321 ug/dL (ref 250–450)
UIBC: 165 ug/dL (ref 131–425)

## 2021-03-07 LAB — CALCIUM: Calcium: 8.8 mg/dL (ref 8.7–10.2)

## 2021-03-07 LAB — FOLATE: Folate: 10.4 ng/mL (ref >3.0)

## 2021-03-07 LAB — VITAMIN B12: Vitamin B-12: 477 pg/mL (ref 232–1245)

## 2021-03-07 LAB — VITAMIN B1: Thiamine: 116.1 nmol/L (ref 66.5–200.0)

## 2021-03-10 ENCOUNTER — Encounter: Payer: Self-pay | Admitting: Obstetrics

## 2021-03-17 ENCOUNTER — Other Ambulatory Visit: Payer: Self-pay

## 2021-03-17 ENCOUNTER — Encounter: Payer: Self-pay | Admitting: Obstetrics and Gynecology

## 2021-03-17 ENCOUNTER — Ambulatory Visit (INDEPENDENT_AMBULATORY_CARE_PROVIDER_SITE_OTHER): Payer: Medicaid Other | Admitting: Obstetrics and Gynecology

## 2021-03-17 ENCOUNTER — Ambulatory Visit: Payer: Medicaid Other

## 2021-03-17 VITALS — BP 107/66 | HR 71 | Wt 210.7 lb

## 2021-03-17 DIAGNOSIS — O26893 Other specified pregnancy related conditions, third trimester: Secondary | ICD-10-CM | POA: Diagnosis not present

## 2021-03-17 DIAGNOSIS — Z6791 Unspecified blood type, Rh negative: Secondary | ICD-10-CM

## 2021-03-17 DIAGNOSIS — Z3483 Encounter for supervision of other normal pregnancy, third trimester: Secondary | ICD-10-CM

## 2021-03-17 DIAGNOSIS — Z9884 Bariatric surgery status: Secondary | ICD-10-CM

## 2021-03-17 DIAGNOSIS — Z3A31 31 weeks gestation of pregnancy: Secondary | ICD-10-CM

## 2021-03-17 MED ORDER — RHO D IMMUNE GLOBULIN 1500 UNIT/2ML IJ SOSY
300.0000 ug | PREFILLED_SYRINGE | Freq: Once | INTRAMUSCULAR | Status: AC
  Administered 2021-03-17: 15:00:00 300 ug via INTRAMUSCULAR

## 2021-03-17 NOTE — Progress Notes (Signed)
ROB:  TOLAC discussed in detail.  Risk benefits discussed.  VBAC calculator discussed and reviewed.  All questions answered.  Patient still considering TOLAC (as things stand currently I think this is very reasonable).   Ultrasound performed today- 31st percentile growth.  Patient continues to drink protein shakes and increase carbohydrates.

## 2021-03-17 NOTE — Progress Notes (Signed)
ROB: She is doing well. Rhogam done today. Unable to leave urine at intake.

## 2021-03-31 ENCOUNTER — Other Ambulatory Visit: Payer: Self-pay

## 2021-03-31 ENCOUNTER — Ambulatory Visit (INDEPENDENT_AMBULATORY_CARE_PROVIDER_SITE_OTHER): Payer: Medicaid Other | Admitting: Certified Nurse Midwife

## 2021-03-31 VITALS — BP 104/70 | HR 79 | Wt 209.9 lb

## 2021-03-31 DIAGNOSIS — Z3A34 34 weeks gestation of pregnancy: Secondary | ICD-10-CM

## 2021-03-31 DIAGNOSIS — Z3483 Encounter for supervision of other normal pregnancy, third trimester: Secondary | ICD-10-CM

## 2021-03-31 DIAGNOSIS — Z9884 Bariatric surgery status: Secondary | ICD-10-CM

## 2021-03-31 LAB — POCT URINALYSIS DIPSTICK OB
Bilirubin, UA: NEGATIVE
Blood, UA: NEGATIVE
Glucose, UA: NEGATIVE
Ketones, UA: NEGATIVE
Leukocytes, UA: NEGATIVE
Nitrite, UA: NEGATIVE
POC,PROTEIN,UA: NEGATIVE
Spec Grav, UA: 1.02 (ref 1.010–1.025)
Urobilinogen, UA: 0.2 E.U./dL
pH, UA: 7 (ref 5.0–8.0)

## 2021-03-31 NOTE — Progress Notes (Signed)
ROB doing well, feels good movement. TOLAC consent form signed, scanned to chart. Discussed rpt growth u/s & NST next visit. She verbalizes and agrees to plan. Follow up 2 wks for NST/growth u/s & ROB with Missy .

## 2021-03-31 NOTE — Patient Instructions (Signed)
Preparing for Vaginal Birth After Cesarean Delivery Vaginal birth after cesarean delivery (VBAC) means giving birth vaginally after previously delivering a baby through a cesarean section (C-section). You and your health care provider will discuss your options and whether you may be a good candidate for VBAC. Types of options After a cesarean birth, your options for future deliveries may include: A scheduled cesarean birth. This is done in a hospital with an operating room. A trial of labor after cesarean. A successful trial of labor results in a vaginal delivery. If it is not successful, you will need to have a cesarean birth. A trial of labor after a cesarean should be attempted in facilities where an emergency cesarean birth can be performed. What are the benefits? The benefits of delivering your baby vaginally instead of by a cesarean birth include: A shorter hospital stay. A faster recovery time. Less pain. Avoiding risks associated with major surgery, such as infection and blood clots. Less blood loss and less need for donated blood (transfusions). What are the risks? The main risk of attempting a VBAC is that it may fail. This would force your health care provider to deliver your baby by a C-section. Other risks are rare and may include: Tearing (rupture) of the scar from a past cesarean birth. Other risks associated with vaginal deliveries. If a repeat cesarean birth is needed, the risks include: Blood loss. Infection. Blood clot. Damage to surrounding organs. Removal of the uterus (hysterectomy), if it is damaged. Placenta problems in future pregnancies. What should I know about my past cesarean delivery? It is important to know what type of incision was made in your uterus in a past cesarean birth, such as vertical or transverse. The type of incision can affect the success of your trial of labor. Who are the best candidates for VBAC? The best candidates for VBAC are women  who: Have had one or two prior cesarean births, and their incision makes it safe to attempt a vaginal delivery. Do not have a scar on their uterus. A scar would make it unsafe to attempt a vaginal delivery. Have not had a tear in the wall of their uterus (uterine rupture). Plan to have more pregnancies. A VBAC is also more likely to be successful: In women who have previously given birth vaginally. When labor starts by itself (spontaneously) before the due date. When is VBAC not an option? As your pregnancy progresses, circumstances may change and you may need to reconsider your options. Your situation may also change as you begin a trial of labor. Your health care provider may not want you to attempt a VBAC if: You have had more than two cesarean births. You are past your due date. Your baby's suspected weight is 8.8 lb (4 kg) or more. The scar on your uterus from a previous cesarean birth makes it unsafe to deliver vaginally. You have a history of a uterine rupture. You have preeclampsia. This is a condition that causes high blood pressure along with other symptoms, such as swelling and headaches. What else should I know about my options? Delivering a baby through a VBAC is similar to having a normal spontaneous vaginal delivery. Therefore, it is safe: To try with twins. For your health care provider to try to turn the baby from a breech position (external cephalic version) during labor. With epidural analgesia for pain relief. Consider where you would like to deliver your baby. VBAC should be attempted in facilities where an emergency cesarean birth can be   performed. VBAC is not recommended for home births. Any changes in your health or your baby's health during your pregnancy may make it necessary to change your initial decision about VBAC. Questions to ask your health care provider Am I a good candidate for the trial of labor? What are my chances of a successful vaginal delivery? Is my  preferred birth location equipped for a trial of labor? What are my pain management options during a trial of labor? Where to find more information American College of Obstetricians and Gynecologists: www.acog.org American College of Nurse-Midwives: www.midwife.org Summary Vaginal birth after cesarean delivery (VBAC) is giving birth vaginally after previously having a baby through a cesarean section (C-section). A trial of labor should be attempted in facilities where emergency cesarean section procedures can be performed. Your health care provider may recommend a repeat cesarean birth if a vaginal delivery is not safe for you. This information is not intended to replace advice given to you by your health care provider. Make sure you discuss any questions you have with your health care provider. Document Revised: 02/06/2020 Document Reviewed: 02/06/2020 Elsevier Patient Education  2022 Elsevier Inc.  

## 2021-04-08 ENCOUNTER — Encounter: Payer: Self-pay | Admitting: Certified Nurse Midwife

## 2021-04-09 ENCOUNTER — Other Ambulatory Visit: Payer: Self-pay

## 2021-04-09 MED ORDER — FLUOXETINE HCL 20 MG PO CAPS: 20.0000 mg | ORAL_CAPSULE | Freq: Every day | ORAL | 9 refills | Status: DC

## 2021-04-14 ENCOUNTER — Ambulatory Visit (INDEPENDENT_AMBULATORY_CARE_PROVIDER_SITE_OTHER): Payer: Medicaid Other | Admitting: Obstetrics

## 2021-04-14 ENCOUNTER — Other Ambulatory Visit: Payer: Medicaid Other

## 2021-04-14 ENCOUNTER — Other Ambulatory Visit: Payer: Self-pay

## 2021-04-14 ENCOUNTER — Ambulatory Visit: Payer: Medicaid Other

## 2021-04-14 VITALS — BP 115/70 | Wt 208.0 lb

## 2021-04-14 DIAGNOSIS — O0990 Supervision of high risk pregnancy, unspecified, unspecified trimester: Secondary | ICD-10-CM

## 2021-04-14 DIAGNOSIS — Z3A36 36 weeks gestation of pregnancy: Secondary | ICD-10-CM

## 2021-04-14 NOTE — Addendum Note (Signed)
Addended by: Liliane Shi on: 04/14/2021 10:18 AM   Modules accepted: Orders

## 2021-04-14 NOTE — Progress Notes (Signed)
Pt has had some braxton hicks, no vb. No lof. GBS today.

## 2021-04-14 NOTE — Progress Notes (Signed)
ROB at 36w. Good fetal movement. RNST (see note). Having some BH but no regular ctx. Denies LOF and VB. Discussed natural cervical ripening after 37 weeks. Cherri strongly desires a TOLAC. Reviewed recommendation for IOL in 39th week of pregnancy. GBS, GC/chlamydia swabs collected. Growth [place] rescheduled for Monday. RTC in one week.  Lloyd Huger, CNM

## 2021-04-15 ENCOUNTER — Observation Stay
Admission: EM | Admit: 2021-04-15 | Discharge: 2021-04-15 | Disposition: A | Discharge status: Home / Self Care | Payer: Medicaid Other | Attending: Obstetrics | Admitting: Obstetrics

## 2021-04-15 ENCOUNTER — Encounter: Payer: Self-pay | Admitting: Obstetrics & Gynecology

## 2021-04-15 ENCOUNTER — Other Ambulatory Visit: Payer: Self-pay

## 2021-04-15 DIAGNOSIS — R1013 Epigastric pain: Secondary | ICD-10-CM | POA: Diagnosis not present

## 2021-04-15 DIAGNOSIS — O26893 Other specified pregnancy related conditions, third trimester: Principal | ICD-10-CM | POA: Insufficient documentation

## 2021-04-15 DIAGNOSIS — Z3A36 36 weeks gestation of pregnancy: Secondary | ICD-10-CM | POA: Insufficient documentation

## 2021-04-15 LAB — CBC WITH DIFFERENTIAL/PLATELET
Abs Immature Granulocytes: 0.02 10*3/uL (ref 0.00–0.07)
Basophils Absolute: 0 10*3/uL (ref 0.0–0.1)
Basophils Relative: 0 %
Eosinophils Absolute: 0 10*3/uL (ref 0.0–0.5)
Eosinophils Relative: 0 %
HCT: 31.8 % — ABNORMAL LOW (ref 36.0–46.0)
Hemoglobin: 11.2 g/dL — ABNORMAL LOW (ref 12.0–15.0)
Immature Granulocytes: 0 %
Lymphocytes Relative: 26 %
Lymphs Abs: 1.8 10*3/uL (ref 0.7–4.0)
MCH: 32.7 pg (ref 26.0–34.0)
MCHC: 35.2 g/dL (ref 30.0–36.0)
MCV: 93 fL (ref 80.0–100.0)
Monocytes Absolute: 0.4 10*3/uL (ref 0.1–1.0)
Monocytes Relative: 5 %
Neutro Abs: 4.7 10*3/uL (ref 1.7–7.7)
Neutrophils Relative %: 69 %
Platelets: 160 10*3/uL (ref 150–400)
RBC: 3.42 MIL/uL — ABNORMAL LOW (ref 3.87–5.11)
RDW: 13.3 % (ref 11.5–15.5)
WBC: 6.9 10*3/uL (ref 4.0–10.5)
nRBC: 0 % (ref 0.0–0.2)

## 2021-04-15 LAB — COMPREHENSIVE METABOLIC PANEL
ALT: 18 U/L (ref 0–44)
AST: 19 U/L (ref 15–41)
Albumin: 2.8 g/dL — ABNORMAL LOW (ref 3.5–5.0)
Alkaline Phosphatase: 155 U/L — ABNORMAL HIGH (ref 38–126)
Anion gap: 9 (ref 5–15)
BUN: 12 mg/dL (ref 6–20)
CO2: 20 mmol/L — ABNORMAL LOW (ref 22–32)
Calcium: 8.6 mg/dL — ABNORMAL LOW (ref 8.9–10.3)
Chloride: 105 mmol/L (ref 98–111)
Creatinine, Ser: 0.69 mg/dL (ref 0.44–1.00)
GFR, Estimated: >60 mL/min (ref >60)
Glucose, Bld: 91 mg/dL (ref 70–99)
Potassium: 3.9 mmol/L (ref 3.5–5.1)
Sodium: 134 mmol/L — ABNORMAL LOW (ref 135–145)
Total Bilirubin: 0.9 mg/dL (ref 0.3–1.2)
Total Protein: 5.8 g/dL — ABNORMAL LOW (ref 6.5–8.1)

## 2021-04-15 NOTE — OB Triage Note (Signed)
Pt discharged home in stable condition per CNM order. Discharge instructions reviewed. Pt and significant other verbalize understanding.

## 2021-04-15 NOTE — OB Triage Note (Signed)
Pt is a 25yo G4P3003, 36w 1d. She arrived to the unit with complaints of upper abdominal pain that started early this morning. Pt states that the pain was a 10/10 radiating to her back at times. She took tylenol at 1500 which resolved the pain for a few hours, pain rebounded at 10/10 again pt took another tylenol at 2100 and currently has no pain at this time. Pt states that the pain seems to get worse when she eats/drinks anything. Pt states that she has had more than normal amount of heart burn this week. Pt denies vaginal bleeding, reports positive fetal movement, and denies contractions. VS stable, monitors applied and assessing.   Initial FHT 145 at 2218.

## 2021-04-15 NOTE — OB Triage Note (Signed)
LABOR & DELIVERY OB TRIAGE NOTE  SUBJECTIVE  HPI Shirley Hayes is a 26 y.o. [phone] at [redacted]w[redacted]d who presents to Labor & Delivery for epigastric pain. She reports that the pain started today around 1400. She took some Tylenol around 1530 which caused some improvement. Then she ate some food which made it worse. She has been taking Tums with little relief. She took another Tylenol around 1900, and this relieved the pain completely. She had a bowel movement yesterday, and states that she is typically somewhat constipated. She had this type of pain earlier in her pregnancy and it was attributed to constipation. She denies vomiting, diarrhea, or black tarry stools. She has had some bright red rectal bleeding due to a known hemorrhoid. She has a history of gastric bypass surgery. She denies contractions, leaking of fluid, and vaginal bleeding. She is not currently in pain.  OB History     Gravida  4   Para  3   Term  3   Preterm      AB      Living  3      SAB      IAB      Ectopic      Multiple  0   Live Births  3        Obstetric Comments  1st Menstrual Cycle:  10 1st Pregnancy:  13            OBJECTIVE  BP 102/62 (BP Location: Left Arm)    Pulse 76    Temp 98.2 F (36.8 C) (Oral)    Resp 16    Ht 5\' 7"  (1.702 m)    Wt 94.3 kg    LMP 08/05/2020 (Approximate)    BMI 32.58 kg/m   CBC and CMP collected  NST I reviewed the NST and it was reactive.  Baseline: Variability: moderate Accelerations: present, 15x15 Decelerations:none Toco: one contraction noted Category 1  Physical Exam General: Alert, cooperative, in no acute distress Cardiovascular: RRR, no murmur Lungs: CTAB GI/GU: Abdomen gravid, non-tender to palpation. No suprapubic tenderness. Normal bowel sounds.  ASSESSMENT Impression  1) Pregnancy at 08/07/2020, [redacted]w[redacted]d, Estimated Date of Delivery: 05/12/21 2) Reassuring maternal/fetal status 3) Epigastric pain. GERD vs peptic ulcer  PLAN   Discharge home. Keep scheduled prenatal visits. Recommend omeprazole BID, bland diet Outpatient referral to GI Advised Myrth to contact her gastric bypass surgeon for consult  05/14/21, CNM

## 2021-04-16 ENCOUNTER — Encounter: Payer: Self-pay | Admitting: Obstetrics

## 2021-04-16 ENCOUNTER — Telehealth: Payer: Self-pay

## 2021-04-16 ENCOUNTER — Other Ambulatory Visit: Payer: Self-pay | Admitting: Obstetrics

## 2021-04-16 DIAGNOSIS — O26899 Other specified pregnancy related conditions, unspecified trimester: Secondary | ICD-10-CM

## 2021-04-16 LAB — STREP GP B NAA: Strep Gp B NAA: NEGATIVE

## 2021-04-16 NOTE — Telephone Encounter (Signed)
CALLED PATIENT NO ANSWER LEFT VOICEMAIL FOR A CALL BACK ? ?

## 2021-04-17 LAB — GC/CHLAMYDIA PROBE AMP
Chlamydia trachomatis, NAA: NEGATIVE
Neisseria Gonorrhoeae by PCR: NEGATIVE

## 2021-04-19 ENCOUNTER — Other Ambulatory Visit: Payer: Self-pay

## 2021-04-19 ENCOUNTER — Ambulatory Visit (INDEPENDENT_AMBULATORY_CARE_PROVIDER_SITE_OTHER): Payer: Medicaid Other

## 2021-04-19 ENCOUNTER — Telehealth: Payer: Self-pay

## 2021-04-19 DIAGNOSIS — Z3483 Encounter for supervision of other normal pregnancy, third trimester: Secondary | ICD-10-CM | POA: Diagnosis not present

## 2021-04-19 DIAGNOSIS — Z9884 Bariatric surgery status: Secondary | ICD-10-CM | POA: Diagnosis not present

## 2021-04-19 NOTE — Telephone Encounter (Signed)
Patient will call us when she is ready to schedule.

## 2021-04-20 ENCOUNTER — Telehealth: Payer: Self-pay

## 2021-04-20 NOTE — Telephone Encounter (Signed)
Patient wanted off the call list and stated she will call us when she is ready to schedule

## 2021-04-21 ENCOUNTER — Other Ambulatory Visit: Payer: Self-pay

## 2021-04-21 ENCOUNTER — Ambulatory Visit (INDEPENDENT_AMBULATORY_CARE_PROVIDER_SITE_OTHER): Payer: Medicaid Other | Admitting: Obstetrics

## 2021-04-21 VITALS — BP 104/69 | HR 60 | Wt 208.9 lb

## 2021-04-21 DIAGNOSIS — O365990 Maternal care for other known or suspected poor fetal growth, unspecified trimester, not applicable or unspecified: Secondary | ICD-10-CM

## 2021-04-21 DIAGNOSIS — Z3A37 37 weeks gestation of pregnancy: Secondary | ICD-10-CM

## 2021-04-21 DIAGNOSIS — O0990 Supervision of high risk pregnancy, unspecified, unspecified trimester: Secondary | ICD-10-CM

## 2021-04-21 NOTE — Progress Notes (Signed)
ROB at 37w. Good fetal movement. Denies ctx, LOF, vaginal bleeding. Feeling much better since starting omeprazole; stomach pain has improved significantly. Growth US shows AC in 2nd percentile. Consulted with Dr. Valentino Saxon who recommends urgent referral to MFM and a f/u growth scan. Orders sent. [facility] requests SVE today - FT/thick/high. Reviewed when to go to the hospital.  ? ?Guadlupe Spanish, CNM ?

## 2021-04-27 ENCOUNTER — Ambulatory Visit: Payer: Medicaid Other | Attending: Obstetrics and Gynecology

## 2021-04-27 ENCOUNTER — Ambulatory Visit (HOSPITAL_BASED_OUTPATIENT_CLINIC_OR_DEPARTMENT_OTHER): Payer: Medicaid Other | Admitting: Obstetrics and Gynecology

## 2021-04-27 ENCOUNTER — Other Ambulatory Visit: Payer: Self-pay

## 2021-04-27 DIAGNOSIS — O0990 Supervision of high risk pregnancy, unspecified, unspecified trimester: Secondary | ICD-10-CM

## 2021-04-27 DIAGNOSIS — Z3A37 37 weeks gestation of pregnancy: Secondary | ICD-10-CM | POA: Diagnosis not present

## 2021-04-27 DIAGNOSIS — O99843 Bariatric surgery status complicating pregnancy, third trimester: Secondary | ICD-10-CM

## 2021-04-27 DIAGNOSIS — O365930 Maternal care for other known or suspected poor fetal growth, third trimester, not applicable or unspecified: Secondary | ICD-10-CM | POA: Diagnosis not present

## 2021-04-27 DIAGNOSIS — O09213 Supervision of pregnancy with history of pre-term labor, third trimester: Secondary | ICD-10-CM

## 2021-04-27 DIAGNOSIS — O0993 Supervision of high risk pregnancy, unspecified, third trimester: Secondary | ICD-10-CM | POA: Diagnosis present

## 2021-04-27 DIAGNOSIS — O34219 Maternal care for unspecified type scar from previous cesarean delivery: Secondary | ICD-10-CM

## 2021-04-27 NOTE — Progress Notes (Signed)
Maternal-Fetal Medicine  ? ?Name: Shirley Hayes  ?DOB: 10/09/1995 ?MRN: [id number] ?Referring Provider: Hildred Laser, MD  ? ?I had the pleasure of seeing Ms. Buchan today at [facility]. She is G4 P3003 at 37w 6d gestation and is here for a second opinion. At your office ultrasound, the estimated fetal weight was at the 27th percentile and the abdominal circumference measurement was at the 2nd percentile. Fetal growth restriction was suspected based on smaller abdominal circumference measurement. ? ?Her prenatal course was, otherwise, uneventful.  On cell-free fetal DNA screening, the risks of fetal aneuploidies are not increased.  She does not have gestational diabetes. ?Obstetrical history significant for a term vaginal delivery in 2012 of a female infant weighing 3,234 g at birth.  Subsequently, she had 2 term cesarean deliveries of female infants and the birth weights where 3,493 and 4,000 g at birth.  Her pregnancies were not complicated by fetal growth restriction. ? ?She is planning to have vaginal birth. ? ?Past surgical history is significant for Rou-en-Y gastric bypass surgery in 2021 and the patient reports she had a weight loss of around 140 pounds. ?Her blood pressure today at her office is 108/76 mmHg. ? ?Ultrasound ?On today's ultrasound, the estimated fetal weight is at the 49 percentile and the abdominal circumference measurement is at the 32nd percentile.  Cephalic presentation.  Amniotic fluid is normal and good fetal activity seen.  Antenatal testing is reassuring.  Umbilical artery Doppler showed normal forward diastolic flow.  BPP 8/8.  Fetal anatomical survey is very limited because of gestational age but no obvious fetal structural defects are seen. ? ?Fetal growth restriction ?I explained the findings of normal fetal growth assessment and reassured her.  Ultrasound has limitations in accurately estimating fetal weights and there is a margin of error of about 15%.  Based on today's  ultrasound and your office estimation of fetal weight at the 27th percentile, she is less likely to have fetal growth restriction. ?Delivery may be considered at [redacted] weeks gestation. ? ?Trial of labor after cesarean delivery ?In patients with 1 previous cesarean delivery, the risk of uterine scar rupture is between 0.5% to 1%.  In patient with more than 1 cesarean delivery, the risk is only slightly increased (1% to 2%).  Foley bulb catheter and/or oxytocin induction may be considered.  Close monitoring for uterine scar rupture including fetal heart decelerations should be part of trial of labor after cesarean delivery. ?Patient is keen to have vaginal birth. ? ?Recommendations ?-Consider delivery at [redacted] weeks gestation. ? ?Thank you for consultation.  If you have any questions or concerns, please contact me the [facility].  Consultation including face-to-face counseling (more than 50% of time spent) is 30 minutes. ? ? ? ? ?

## 2021-04-28 ENCOUNTER — Ambulatory Visit (INDEPENDENT_AMBULATORY_CARE_PROVIDER_SITE_OTHER): Payer: Medicaid Other | Admitting: Obstetrics

## 2021-04-28 ENCOUNTER — Other Ambulatory Visit: Payer: Self-pay | Admitting: Obstetrics

## 2021-04-28 VITALS — BP 128/78 | HR 90 | Wt 208.8 lb

## 2021-04-28 DIAGNOSIS — O0993 Supervision of high risk pregnancy, unspecified, third trimester: Secondary | ICD-10-CM

## 2021-04-28 NOTE — Progress Notes (Signed)
ROB at 38w. Good fetal movement. Reassuring US/BPP with MFM. Recommend IOL at 39 weeks. Scheduled for 05/05/21 @ 0500. Requested SVE/sweep. 1/50/-2. Cervix swept. Knows when to go to the hospital. Reviewed routine for IOL. ? ?Shirley Hayes, CNM ?

## 2021-05-03 ENCOUNTER — Other Ambulatory Visit: Payer: Self-pay

## 2021-05-03 ENCOUNTER — Other Ambulatory Visit
Admission: RE | Admit: 2021-05-03 | Discharge: 2021-05-03 | Disposition: A | Discharge status: Home / Self Care | Payer: Medicaid Other | Source: Ambulatory Visit | Attending: Certified Nurse Midwife | Admitting: Certified Nurse Midwife

## 2021-05-03 ENCOUNTER — Ambulatory Visit (INDEPENDENT_AMBULATORY_CARE_PROVIDER_SITE_OTHER): Payer: Medicaid Other | Admitting: Certified Nurse Midwife

## 2021-05-03 VITALS — BP 105/70 | HR 64 | Wt 210.3 lb

## 2021-05-03 DIAGNOSIS — Z20822 Contact with and (suspected) exposure to covid-19: Secondary | ICD-10-CM | POA: Diagnosis not present

## 2021-05-03 DIAGNOSIS — Z01812 Encounter for preprocedural laboratory examination: Secondary | ICD-10-CM | POA: Insufficient documentation

## 2021-05-03 DIAGNOSIS — Z3483 Encounter for supervision of other normal pregnancy, third trimester: Secondary | ICD-10-CM

## 2021-05-03 DIAGNOSIS — Z3A38 38 weeks gestation of pregnancy: Secondary | ICD-10-CM

## 2021-05-03 LAB — POCT URINALYSIS DIPSTICK OB
Bilirubin, UA: NEGATIVE
Blood, UA: NEGATIVE
Glucose, UA: NEGATIVE
Ketones, UA: NEGATIVE
Leukocytes, UA: NEGATIVE
Nitrite, UA: NEGATIVE
POC,PROTEIN,UA: NEGATIVE
Spec Grav, UA: 1.025 (ref 1.010–1.025)
Urobilinogen, UA: 0.2 E.U./dL
pH, UA: 6 (ref 5.0–8.0)

## 2021-05-03 LAB — SARS CORONAVIRUS 2 (TAT 6-24 HRS): SARS Coronavirus 2: NEGATIVE

## 2021-05-03 NOTE — Progress Notes (Signed)
ROB doing well, feeling uncomfortable due to baby size. She is feeling good movement. Admits to having episode of contractions on Saturday that resolved. Request SVE today. 2/65/-3. Discussed induction on Wednesday. Has some ear pressure. Left ear. Looked at ear, normal in appearance, small amount of wax noted. Self help measures reviewed. She is going to have COVID testing after this visit. Follow up prn or as scheduled for induction  ? ?Philip Aspen, CNM  ?

## 2021-05-05 ENCOUNTER — Other Ambulatory Visit: Payer: Self-pay

## 2021-05-05 ENCOUNTER — Inpatient Hospital Stay
Admission: EM | Admit: 2021-05-05 | Discharge: 2021-05-06 | DRG: 807 | Disposition: A | Discharge status: Home / Self Care | Payer: Medicaid Other | Attending: Obstetrics | Admitting: Obstetrics

## 2021-05-05 ENCOUNTER — Encounter: Payer: Self-pay | Admitting: Obstetrics

## 2021-05-05 ENCOUNTER — Inpatient Hospital Stay: Payer: Medicaid Other | Admitting: Certified Registered"

## 2021-05-05 DIAGNOSIS — O99844 Bariatric surgery status complicating childbirth: Principal | ICD-10-CM | POA: Diagnosis present

## 2021-05-05 DIAGNOSIS — Z87891 Personal history of nicotine dependence: Secondary | ICD-10-CM

## 2021-05-05 DIAGNOSIS — E8801 Alpha-1-antitrypsin deficiency: Secondary | ICD-10-CM | POA: Diagnosis present

## 2021-05-05 DIAGNOSIS — O34219 Maternal care for unspecified type scar from previous cesarean delivery: Secondary | ICD-10-CM | POA: Diagnosis present

## 2021-05-05 DIAGNOSIS — O26893 Other specified pregnancy related conditions, third trimester: Secondary | ICD-10-CM | POA: Diagnosis present

## 2021-05-05 DIAGNOSIS — Z3A39 39 weeks gestation of pregnancy: Secondary | ICD-10-CM | POA: Diagnosis not present

## 2021-05-05 LAB — TYPE AND SCREEN
ABO/RH(D): O NEG
Antibody Screen: POSITIVE

## 2021-05-05 LAB — CBC
HCT: 34.3 % — ABNORMAL LOW (ref 36.0–46.0)
Hemoglobin: 11.9 g/dL — ABNORMAL LOW (ref 12.0–15.0)
MCH: 32 pg (ref 26.0–34.0)
MCHC: 34.7 g/dL (ref 30.0–36.0)
MCV: 92.2 fL (ref 80.0–100.0)
Platelets: 160 10*3/uL (ref 150–400)
RBC: 3.72 MIL/uL — ABNORMAL LOW (ref 3.87–5.11)
RDW: 13.4 % (ref 11.5–15.5)
WBC: 5.7 10*3/uL (ref 4.0–10.5)
nRBC: 0 % (ref 0.0–0.2)

## 2021-05-05 LAB — RPR: RPR Ser Ql: NONREACTIVE

## 2021-05-05 MED ORDER — BUTORPHANOL TARTRATE 1 MG/ML IJ SOLN: 1.0000 mg | INTRAMUSCULAR | Status: DC | PRN

## 2021-05-05 MED ORDER — PANTOPRAZOLE SODIUM 40 MG PO TBEC
40.0000 mg | DELAYED_RELEASE_TABLET | Freq: Every day | ORAL | Status: DC
  Administered 2021-05-05 – 2021-05-06 (×2): 40 mg via ORAL
  Filled 2021-05-05 (×2): qty 1

## 2021-05-05 MED ORDER — OXYTOCIN-SODIUM CHLORIDE 30-0.9 UT/500ML-% IV SOLN
2.5000 [IU]/h | INTRAVENOUS | Status: DC
  Administered 2021-05-05: 2.5 [IU]/h via INTRAVENOUS

## 2021-05-05 MED ORDER — FENTANYL-BUPIVACAINE-NACL 0.5-0.125-0.9 MG/250ML-% EP SOLN
EPIDURAL | Status: DC | PRN
Start: 2021-05-05 — End: 2021-05-05
  Administered 2021-05-05: 12 mL/h via EPIDURAL

## 2021-05-05 MED ORDER — DIPHENHYDRAMINE HCL 50 MG/ML IJ SOLN: 12.5000 mg | INTRAMUSCULAR | Status: DC | PRN

## 2021-05-05 MED ORDER — METHYLERGONOVINE MALEATE 0.2 MG/ML IJ SOLN
0.2000 mg | INTRAMUSCULAR | Status: DC | PRN
  Filled 2021-05-05: qty 1

## 2021-05-05 MED ORDER — LACTATED RINGERS IV SOLN: INTRAVENOUS | Status: DC

## 2021-05-05 MED ORDER — IBUPROFEN 600 MG PO TABS
600.0000 mg | ORAL_TABLET | Freq: Four times a day (QID) | ORAL | Status: DC
Start: 2021-05-06 — End: 2021-05-07

## 2021-05-05 MED ORDER — HYDROXYZINE HCL 25 MG PO TABS
50.0000 mg | ORAL_TABLET | Freq: Four times a day (QID) | ORAL | Status: DC | PRN
  Administered 2021-05-05: 50 mg via ORAL
  Filled 2021-05-05 (×2): qty 1

## 2021-05-05 MED ORDER — OXYTOCIN 10 UNIT/ML IJ SOLN
INTRAMUSCULAR | Status: AC
  Filled 2021-05-05: qty 2

## 2021-05-05 MED ORDER — OXYTOCIN-SODIUM CHLORIDE 30-0.9 UT/500ML-% IV SOLN
2.5000 [IU]/h | INTRAVENOUS | Status: DC | PRN
Start: 2021-05-05 — End: 2021-05-07
  Administered 2021-05-05: 2.5 [IU]/h via INTRAVENOUS

## 2021-05-05 MED ORDER — PHENYLEPHRINE 40 MCG/ML (10ML) SYRINGE FOR IV PUSH (FOR BLOOD PRESSURE SUPPORT): 80.0000 ug | PREFILLED_SYRINGE | INTRAVENOUS | Status: DC | PRN

## 2021-05-05 MED ORDER — LIDOCAINE HCL (PF) 1 % IJ SOLN
INTRAMUSCULAR | Status: DC | PRN
  Administered 2021-05-05: 3 mL

## 2021-05-05 MED ORDER — MISOPROSTOL 200 MCG PO TABS
ORAL_TABLET | ORAL | Status: DC
Start: 2021-05-05 — End: 2021-05-05
  Filled 2021-05-05: qty 4

## 2021-05-05 MED ORDER — ONDANSETRON HCL 4 MG/2ML IJ SOLN: 4.0000 mg | Freq: Four times a day (QID) | INTRAMUSCULAR | Status: DC | PRN

## 2021-05-05 MED ORDER — LACTATED RINGERS IV SOLN: 500.0000 mL | INTRAVENOUS | Status: DC | PRN

## 2021-05-05 MED ORDER — AMMONIA AROMATIC IN INHA
RESPIRATORY_TRACT | Status: AC
  Filled 2021-05-05: qty 10

## 2021-05-05 MED ORDER — EPHEDRINE 5 MG/ML INJ
10.0000 mg | INTRAVENOUS | Status: DC | PRN
  Administered 2021-05-05: 10 mg via INTRAVENOUS

## 2021-05-05 MED ORDER — LIDOCAINE-EPINEPHRINE (PF) 1.5 %-1:200000 IJ SOLN
INTRAMUSCULAR | Status: DC | PRN
Start: 2021-05-05 — End: 2021-05-05
  Administered 2021-05-05: 5 mL via PERINEURAL

## 2021-05-05 MED ORDER — ACETAMINOPHEN 325 MG PO TABS
650.0000 mg | ORAL_TABLET | ORAL | Status: DC | PRN
  Administered 2021-05-05: 650 mg via ORAL
  Filled 2021-05-05: qty 2

## 2021-05-05 MED ORDER — FENTANYL-BUPIVACAINE-NACL 0.5-0.125-0.9 MG/250ML-% EP SOLN
EPIDURAL | Status: AC
  Filled 2021-05-05: qty 250

## 2021-05-05 MED ORDER — SIMETHICONE 80 MG PO CHEW: 80.0000 mg | CHEWABLE_TABLET | ORAL | Status: DC | PRN

## 2021-05-05 MED ORDER — COMPLETENATE 29-1 MG PO CHEW: 1.0000 | CHEWABLE_TABLET | Freq: Every day | ORAL | Status: DC

## 2021-05-05 MED ORDER — OXYTOCIN BOLUS FROM INFUSION
333.0000 mL | Freq: Once | INTRAVENOUS | Status: AC
  Administered 2021-05-05: 333 mL via INTRAVENOUS

## 2021-05-05 MED ORDER — BUPIVACAINE HCL (PF) 0.25 % IJ SOLN
INTRAMUSCULAR | Status: DC | PRN
Start: 2021-05-05 — End: 2021-05-05
  Administered 2021-05-05: 2 mL via EPIDURAL
  Administered 2021-05-05: 3 mL via EPIDURAL

## 2021-05-05 MED ORDER — ACETAMINOPHEN 325 MG PO TABS
650.0000 mg | ORAL_TABLET | ORAL | Status: DC | PRN
  Administered 2021-05-05 – 2021-05-06 (×4): 650 mg via ORAL
  Filled 2021-05-05 (×4): qty 2

## 2021-05-05 MED ORDER — LIDOCAINE HCL (PF) 1 % IJ SOLN
30.0000 mL | INTRAMUSCULAR | Status: AC | PRN
  Administered 2021-05-05: 30 mL via SUBCUTANEOUS

## 2021-05-05 MED ORDER — OXYTOCIN-SODIUM CHLORIDE 30-0.9 UT/500ML-% IV SOLN
1.0000 m[IU]/min | INTRAVENOUS | Status: DC
  Administered 2021-05-05: 1 m[IU]/min via INTRAVENOUS
  Filled 2021-05-05: qty 1000

## 2021-05-05 MED ORDER — BENZOCAINE-MENTHOL 20-0.5 % EX AERO
1.0000 "application " | INHALATION_SPRAY | CUTANEOUS | Status: DC | PRN
  Administered 2021-05-05: 1 via TOPICAL
  Filled 2021-05-05: qty 56

## 2021-05-05 MED ORDER — LIDOCAINE HCL (PF) 1 % IJ SOLN
INTRAMUSCULAR | Status: AC
  Filled 2021-05-05: qty 30

## 2021-05-05 MED ORDER — COCONUT OIL OIL
1.0000 "application " | TOPICAL_OIL | Status: DC | PRN
  Filled 2021-05-05: qty 120

## 2021-05-05 MED ORDER — EPHEDRINE 5 MG/ML INJ
10.0000 mg | INTRAVENOUS | Status: DC | PRN
  Administered 2021-05-05: 10 mg via INTRAVENOUS
  Filled 2021-05-05: qty 4

## 2021-05-05 MED ORDER — FLUOXETINE HCL 10 MG PO CAPS
20.0000 mg | ORAL_CAPSULE | Freq: Every day | ORAL | Status: DC
  Administered 2021-05-06: 20 mg via ORAL
  Filled 2021-05-05: qty 1
  Filled 2021-05-05: qty 2

## 2021-05-05 MED ORDER — DOCUSATE SODIUM 100 MG PO CAPS
100.0000 mg | ORAL_CAPSULE | Freq: Two times a day (BID) | ORAL | Status: DC
  Administered 2021-05-05 – 2021-05-06 (×2): 100 mg via ORAL
  Filled 2021-05-05 (×2): qty 1

## 2021-05-05 MED ORDER — LACTATED RINGERS IV SOLN
500.0000 mL | Freq: Once | INTRAVENOUS | Status: AC
  Administered 2021-05-05: 500 mL via INTRAVENOUS

## 2021-05-05 MED ORDER — METHYLERGONOVINE MALEATE 0.2 MG PO TABS
0.2000 mg | ORAL_TABLET | ORAL | Status: DC | PRN
  Filled 2021-05-05: qty 1

## 2021-05-05 MED ORDER — DIPHENHYDRAMINE HCL 25 MG PO CAPS: 25.0000 mg | ORAL_CAPSULE | Freq: Four times a day (QID) | ORAL | Status: DC | PRN

## 2021-05-05 MED ORDER — PRENATAL MULTIVITAMIN CH
1.0000 | ORAL_TABLET | Freq: Every day | ORAL | Status: DC
  Administered 2021-05-06: 1 via ORAL
  Filled 2021-05-05: qty 1

## 2021-05-05 MED ORDER — FENTANYL-BUPIVACAINE-NACL 0.5-0.125-0.9 MG/250ML-% EP SOLN: 12.0000 mL/h | EPIDURAL | Status: DC | PRN

## 2021-05-05 NOTE — Progress Notes (Signed)
LABOR NOTE  ? ?SUBJECTIVE:  ? ?Shirley Hayes is a 26 y.o.GP@ at [redacted]w[redacted]d here for induction of labor d/t h/o gastric bypass. She had been comfortable with her epidural but is now feeling some pain on one side.  ?Analgesia: Epidural ? ?OBJECTIVE:  ?BP 110/76 (BP Location: Left Arm)   Pulse (!) 104   Temp (!) 97.4 ?F (36.3 ?C) (Oral)   Resp 16   Ht 5\' 7"  (1.702 m)   Wt 95.3 kg   LMP 08/05/2020 (Approximate)   SpO2 100%   BMI 32.89 kg/m?  ?No intake/output data recorded. ? ?SVE:   Dilation: 6 ?Effacement (%): 90 ?Station: -1 ?Exam by:: [email or web address] CNM ?CONTRACTIONS: regular, every 2-3 minutes ?FHR: Fetal heart tracing reviewed. ?Baseline: 130 ?Variability: moderate ?Accelerations: present, 15x15 ?Decelerations:none ?Category 1 ? ?Labs: ?Lab Results  ?Component Value Date  ? WBC 5.7 05/05/2021  ? HGB 11.9 (L) 05/05/2021  ? HCT 34.3 (L) 05/05/2021  ? MCV 92.2 05/05/2021  ? PLT 160 05/05/2021  ? ? ?ASSESSMENT: ? Induction of labor due to h/o gastric bypass,  progressing well on pitocin ?    Coping: tearful, anxious ?    Membranes: ruptured, clear fluid ? ?Active Problems: ?  Labor and delivery, indication for care ? ? ?PLAN: ?Continue present management ?PRN hydroxyzine for anxiety ?Repositioning to encourage fetal rotation and improve epidural coverage ?Anticipate NSVD ?Dr. 05/07/2021 updated  ? ?Logan Bores, CNM ?05/05/2021 ?4:31 PM ? ?  ?

## 2021-05-05 NOTE — Anesthesia Preprocedure Evaluation (Signed)
Anesthesia Evaluation  ?Patient identified by MRN, date of birth, ID band ?Patient awake ? ? ? ?Reviewed: ?Allergy & Precautions, H&P , NPO status , Patient's Chart, lab work & pertinent test results ? ?Airway ?Mallampati: II ? ?TM Distance: >3 FB ?Neck ROM: full ? ? ? Dental ?no notable dental hx. ? ?  ?Pulmonary ?asthma , former smoker,  ?  ? ? ? ? ? ? ? Cardiovascular ?negative cardio ROS ? ? ? ? ?  ?Neuro/Psych ?Anxiety Depression negative neurological ROS ?   ? GI/Hepatic ?Neg liver ROS, GERD  ,  ?Endo/Other  ?diabetesHypothyroidism  ? Renal/GU ?negative Renal ROS  ?negative genitourinary ?  ?Musculoskeletal ? ? Abdominal ?  ?Peds ? Hematology ?negative hematology ROS ?(+)   ?Anesthesia Other Findings ? ? Reproductive/Obstetrics ?(+) Pregnancy ? ?  ? ? ? ? ? ? ? ? ? ? ? ? ? ?  ?  ? ? ? ? ? ? ? ? ?Anesthesia Physical ?Anesthesia Plan ? ?ASA: 2 ? ?Anesthesia Plan: Epidural  ? ?Post-op Pain Management:   ? ?Induction:  ? ?PONV Risk Score and Plan:  ? ?Airway Management Planned:  ? ?Additional Equipment:  ? ?Intra-op Plan:  ? ?Post-operative Plan:  ? ?Informed Consent: I have reviewed the patients History and Physical, chart, labs and discussed the procedure including the risks, benefits and alternatives for the proposed anesthesia with the patient or authorized representative who has indicated his/her understanding and acceptance.  ? ? ? ? ? ?Plan Discussed with: Anesthesiologist and CRNA ? ?Anesthesia Plan Comments:   ? ? ? ? ? ? ?Anesthesia Quick Evaluation ? ?

## 2021-05-05 NOTE — Anesthesia Procedure Notes (Signed)
Epidural ?Patient location during procedure: OB ?Start time: 05/05/2021 3:12 PM ?End time: 05/05/2021 3:25 PM ? ?Staffing ?Anesthesiologist: Reed Breech, MD ?Resident/CRNA: Elmarie Mainland, CRNA ?Performed: resident/CRNA  ? ?Preanesthetic Checklist ?Completed: patient identified, IV checked, site marked, risks and benefits discussed, surgical consent, monitors and equipment checked, pre-op evaluation and timeout performed ? ?Epidural ?Patient position: sitting ?Prep: ChloraPrep ?Patient monitoring: heart rate, continuous pulse ox and blood pressure ?Approach: midline ?Location: L3-L4 ?Injection technique: LOR saline ? ?Needle:  ?Needle type: Tuohy  ?Needle gauge: 17 G ?Needle length: 9 cm and 9 ?Needle insertion depth: 8 cm ?Catheter type: closed end flexible ?Catheter size: 19 Gauge ?Catheter at skin depth: 12 cm ?Test dose: negative and 1.5% lidocaine with Epi 1:200 K ? ?Assessment ?Sensory level: T10 ?Events: blood not aspirated, injection not painful, no injection resistance, no paresthesia and negative IV test ? ?Additional Notes ?1 attempt ?Pt. Evaluated and documentation done after procedure finished. ?Patient identified. Risks/Benefits/Options discussed with patient including but not limited to bleeding, infection, nerve damage, paralysis, failed block, incomplete pain control, headache, blood pressure changes, nausea, vomiting, reactions to medication both or allergic, itching and postpartum back pain. Confirmed with bedside nurse the patient's most recent platelet count. Confirmed with patient that they are not currently taking any anticoagulation, have any bleeding history or any family history of bleeding disorders. Patient expressed understanding and wished to proceed. All questions were answered. Sterile technique was used throughout the entire procedure. Please see nursing notes for vital signs. Test dose was given through epidural catheter and negative prior to continuing to dose epidural or start  infusion. Warning signs of high block given to the patient including shortness of breath, tingling/numbness in hands, complete motor block, or any concerning symptoms with instructions to call for help. Patient was given instructions on fall risk and not to get out of bed. All questions and concerns addressed with instructions to call with any issues or inadequate analgesia.   ? ?Patient tolerated the insertion well without immediate complications.Reason for block:procedure for pain ? ? ? ?

## 2021-05-05 NOTE — H&P (Signed)
?History and Physical ? ? ?HPI ? ?Shirley Hayes is a 26 y.o. [id number] at [redacted]w[redacted]d Estimated Date of Delivery: 05/12/21 who is being admitted for induction of labor. She has a history of gastric bypass. She has had one vaginal birth and two subsequent cesareans.  ? ? ?OB History ? ?OB History  ?Gravida Para Term Preterm AB Living  ?4 3 3  0 0 3  ?SAB IAB Ectopic Multiple Live Births  ?0 0 0 0 3  ?  ?# Outcome Date GA Lbr Len/2nd Weight Sex Delivery Anes PTL Lv  ?4 Current           ?3 Term 09/03/15 [redacted]w[redacted]d  4000 g M CS-LTranv Spinal  LIV  ?   Name: TAURIEL, SCRONCE  ?   Apgar1: 9  Apgar5: 9  ?2 Term 2015   3493 g M CS-LTranv   LIV  ?1 Term 2012   3234 g M Vag-Spont   LIV  ?  ?Obstetric Comments  ?1st Menstrual Cycle:  12  ?1st Pregnancy:  15  ? ? ?PROBLEM LIST ? ?Pregnancy complications or risks: ?Patient Active Problem List  ? Diagnosis Date Noted  ? Labor and delivery, indication for care 04/15/2021  ? Indication for care in labor and delivery, antepartum 02/11/2021  ? History of alpha-1-antitrypsin deficiency 10/20/2020  ? History of gestational diabetes 10/20/2020  ? History of gastric bypass 10/07/2020  ? S/P cesarean section 09/03/2015  ? ? ?Prenatal labs and studies: ?ABO, Rh: --/--/O NEG (03/15 0529) ?Antibody: POS (03/15 0529) ?Rubella: 0.99 (08/25 1442) ?RPR: Non Reactive (12/27 1020)  ?HBsAg: Negative (08/25 1442)  ?HIV: Non Reactive (08/25 1442)  ?02-18-1983-- (02/22 1031) ? ? ?Past Medical History:  ?Diagnosis Date  ? Anxiety   ? Asthma   ? physical induced asthma  ? Depression   ? GERD (gastroesophageal reflux disease)   ? Gestational diabetes   ? Hypothyroidism   ? Mental disorder   ? ? ? ?Past Surgical History:  ?Procedure Laterality Date  ? CESAREAN SECTION  [phone]  ? CESAREAN SECTION N/A 09/03/2015  ? Procedure: CESAREAN SECTION;  Surgeon: 09/05/2015, MD;  Location: [facility] ORS;  Service: Obstetrics;  Laterality: N/A;  ? CHOLECYSTECTOMY N/A 01/01/2015  ? Procedure: LAPAROSCOPIC  CHOLECYSTECTOMY WITH INTRAOPERATIVE CHOLANGIOGRAM, laparoscopic incisional hernia repair;  Surgeon: 13/11/2014, MD;  Location: [facility] ORS;  Service: General;  Laterality: N/A;  ? GASTRIC BYPASS  12/24/2019  ? LAPAROSCOPY  02/22/2012  ? ? ? ?Medications   ? ?Current Discharge Medication List  ?  ? ?CONTINUE these medications which have NOT CHANGED  ? Details  ?FLUoxetine (PROZAC) 20 MG capsule Take 1 capsule (20 mg total) by mouth daily. ?Qty: 30 capsule, Refills: 9  ?  ?omeprazole (PRILOSEC) 20 MG capsule Take 1 capsule by mouth daily.  ?  ?prenatal vitamin w/FE, FA (NATACHEW) 29-1 MG CHEW chewable tablet Chew 1 tablet by mouth daily at 12 noon.  ?  ?  ? ? ? ?Allergies ? ?Chlorhexidine gluconate and Tape ? ?Review of Systems ? ?A comprehensive review of systems was negative. ? ?Physical Exam ? ?BP 110/76 (BP Location: Left Arm)   Pulse (!) 104   Temp (!) 97.4 ?F (36.3 ?C) (Oral)   Resp 16   Ht 5\' 7"  (1.702 m)   Wt 95.3 kg   LMP 08/05/2020 (Approximate)   SpO2 100%   BMI 32.89 kg/m?  ? ?Lungs:  CTA B ?Cardio: RRR without M/R/G ?Abd: Soft, gravid, NT ?Presentation: cephalic ?Extremities:  No edema; no s/s of DVT ?CERVIX: Dilation: 3 ?Effacement (%): 60 ?Cervical Position: Posterior ?Station: -2 ?Presentation: Vertex ?Exam by:: Laural Benes RN ? ?See Prenatal records for more detailed PE. ? ?  ? ?FHR:  ?Baseline:130 ?Variability: moderate ?Accelerations: present, 15x15 ?Decelerations: none ?Toco: irregular ?Category 1 ? ?Test Results ? ?Results for orders placed or performed during the hospital encounter of 05/05/21 (from the past 24 hour(s))  ?CBC     Status: Abnormal  ? Collection Time: 05/05/21  5:29 AM  ?Result Value Ref Range  ? WBC 5.7 4.0 - 10.5 K/uL  ? RBC 3.72 (L) 3.87 - 5.11 MIL/uL  ? Hemoglobin 11.9 (L) 12.0 - 15.0 g/dL  ? HCT 34.3 (L) 36.0 - 46.0 %  ? MCV 92.2 80.0 - 100.0 fL  ? MCH 32.0 26.0 - 34.0 pg  ? MCHC 34.7 30.0 - 36.0 g/dL  ? RDW 13.4 11.5 - 15.5 %  ? Platelets 160 150 - 400 K/uL  ?  nRBC 0.0 0.0 - 0.2 %  ?Type and screen     Status: None  ? Collection Time: 05/05/21  5:29 AM  ?Result Value Ref Range  ? ABO/RH(D) O NEG   ? Antibody Screen POS   ? Sample Expiration 05/08/2021,2359   ? Antibody Identification    ?  PASSIVELY ACQUIRED ANTI-D ?Performed at [facility], [address] [place], [place] [address] ?  ? ?Group B Strep negative ? ?Assessment ? ?1) [id number] at [redacted]w[redacted]d Estimated Date of Delivery: 05/12/21  ?2) Reassuring maternal/fetal status. ? ?Patient Active Problem List  ? Diagnosis Date Noted  ? Labor and delivery, indication for care 04/15/2021  ? Indication for care in labor and delivery, antepartum 02/11/2021  ? History of alpha-1-antitrypsin deficiency 10/20/2020  ? History of gestational diabetes 10/20/2020  ? History of gastric bypass 10/07/2020  ? S/P cesarean section 09/03/2015  ? ? ?Plan ? ?1. Admit to L&D :  induction with low-dose Pitocin ?2. EFM: continuous -- Category 1 ?3. Pharmacologic pain relief if desired.   ?4. Admission labs  ?5. Anticipate NSVD ?6. MD notified of admission ? ?Guadlupe Spanish, CNM ?05/05/2021 ?7:46 AM  ?

## 2021-05-05 NOTE — Progress Notes (Signed)
LABOR NOTE  ? ?SUBJECTIVE:  ? ?Shirley Hayes is a 26 y.o.GP@ at [redacted]w[redacted]d being induced for h/o gastric bypass surgery. She is having more regular contractions but they are not yet painful. ?Analgesia: Labor support without medications and considering epidural later ? ?OBJECTIVE:  ?BP 110/76 (BP Location: Left Arm)   Pulse (!) 104   Temp (!) 97.4 ?F (36.3 ?C) (Oral)   Resp 16   Ht 5\' 7"  (1.702 m)   Wt 95.3 kg   LMP 08/05/2020 (Approximate)   SpO2 100%   BMI 32.89 kg/m?  ?No intake/output data recorded. ? ?SVE:   Dilation: 3 ?Effacement (%): 60 ?Station: -2 ?Exam by:: [email or web address] RN ?CONTRACTIONS: regular, every 2-3 minutes ?FHR: Fetal heart tracing reviewed. ?Baseline: 150s ?Variability:moderate ?Accelerations: present, 15x5 ?Decelerations:none ?Category 1 ? ?Labs: ?Lab Results  ?Component Value Date  ? WBC 5.7 05/05/2021  ? HGB 11.9 (L) 05/05/2021  ? HCT 34.3 (L) 05/05/2021  ? MCV 92.2 05/05/2021  ? PLT 160 05/05/2021  ? ? ?ASSESSMENT: ?1) Induction of labor for h/o gastric bypass ?     Contractions getting more frequent but not yet painful ?    Coping: well ?    Membranes: intact ?Active Problems: ?  Labor and delivery, indication for care ? ? ?PLAN: ?-Continue to titrate Pitocin ?-AROM attempted - not successful. Will attempt again if indicated ?-Encourage upright positions and frequent position changes ?-Anticipate NSVD ?-MD updated  ? ?05/07/2021, CNM ?05/05/2021 ?12:19 PM ? ?  ?

## 2021-05-06 LAB — FETAL SCREEN: Fetal Screen: NEGATIVE

## 2021-05-06 MED ORDER — RHO D IMMUNE GLOBULIN 1500 UNIT/2ML IJ SOSY
300.0000 ug | PREFILLED_SYRINGE | Freq: Once | INTRAMUSCULAR | Status: AC
  Administered 2021-05-06: 300 ug via INTRAVENOUS
  Filled 2021-05-06: qty 2

## 2021-05-06 MED ORDER — FLUOXETINE HCL 40 MG PO CAPS: 40.0000 mg | ORAL_CAPSULE | Freq: Every day | ORAL | 3 refills | Status: DC

## 2021-05-06 NOTE — Progress Notes (Signed)
Reviewed D/C instructions with pt and family. Pt verbalized understanding of teaching. Discharged to home via W/C. Pt to schedule f/u appt.  

## 2021-05-06 NOTE — Discharge Instructions (Signed)

## 2021-05-06 NOTE — Anesthesia Postprocedure Evaluation (Signed)
Anesthesia Post Note ? ?Patient: Shirley Hayes ? ?Procedure(s) Performed: AN AD HOC LABOR EPIDURAL ? ?Patient location during evaluation: Mother Baby ?Anesthesia Type: Epidural ?Level of consciousness: awake and alert ?Pain management: pain level controlled ?Vital Signs Assessment: post-procedure vital signs reviewed and stable ?Respiratory status: spontaneous breathing, nonlabored ventilation and respiratory function stable ?Cardiovascular status: stable ?Postop Assessment: no headache, no backache and epidural receding ?Anesthetic complications: no ? ? ?No notable events documented. ? ? ?Last Vitals:  ?Vitals:  ? 05/06/21 0743 05/06/21 1138  ?BP: (!) 105/52 (!) 111/56  ?Pulse: 66 (!) 55  ?Resp: 18 16  ?Temp: 37 ?C 36.9 ?C  ?SpO2: 100% 100%  ?  ?Last Pain:  ?Vitals:  ? 05/06/21 1138  ?TempSrc: Oral  ?PainSc:   ? ? ?  ?  ?  ?  ?  ?  ? ?Irving Burton M ? ? ? ? ?

## 2021-05-06 NOTE — Final Progress Note (Signed)
Post Partum Day 1 ?Subjective: ?Shirley Hayes is doing well with no complaints, up ad lib, voiding, tolerating PO, and + flatus. Breast and bottle feeding. She would like her Prozac increased to 40 mg. ? ?Objective: ?Blood pressure (!) 105/52, pulse 66, temperature 98.6 ?F (37 ?C), temperature source Oral, resp. rate 18, height 5\' 7"  (1.702 m), weight 95.3 kg, last menstrual period 08/05/2020, SpO2 100 %, unknown if currently breastfeeding. ? ?Physical Exam:  ?General: alert, cooperative, and appears stated age ?Lochia: appropriate ?Uterine Fundus: firm ?Laceration: healing well, no significant drainage ?DVT Evaluation: No evidence of DVT seen on physical exam. ? ?Recent Labs  ?  05/05/21 ?[id number]  ?HGB 11.9*  ?HCT 34.3*  ? ? ?Assessment/Plan: ?Discharge home ?Rx sent to pharmacy ?Video visit in 2 weeks, office visit in 6 weeks ? LOS: 1 day  ? ?Lurlean Horns ?05/06/2021, 9:48 AM  ? ? ?

## 2021-05-06 NOTE — Lactation Note (Signed)
This note was copied from a baby's chart. ?Lactation Consultation Note ? ?Patient Name: Shirley Hayes ?Today's Date: 05/06/2021 ?Reason for consult: Initial assessment;Term;Breastfeeding assistance;Other (Comment) (Mom with concerns regarding breastfeeding after weight loss of 150 pounds prior to her getting pregnant. She was concerned about sagging skin of her breasts and how it would affect breastfeeding.) ?Age:28 hours ? ?Maternal Data ?Has patient been taught Hand Expression?: Yes ?Does the patient have breastfeeding experience prior to this delivery?: Yes ?How long did the patient breastfeed?: 6 months (Mom breastfed her previous child for 6 months) ? ?Feeding ?Mother's Current Feeding Choice: Breast Milk and Formula ? ?LATCH Score ?Latch: Grasps breast easily, tongue down, lips flanged, rhythmical sucking. (upper lip slightly rolled inward, mom could adjust lip and recommended mom relatch to achieve deeper latch.) ? ?Audible Swallowing: Spontaneous and intermittent ? ?Type of Nipple: Everted at rest and after stimulation (mom with compression stripe on left nipple with slight crack provided coconut oil to assist with moist wound healing.) ? ?Comfort (Breast/Nipple): Soft / non-tender ? ?Hold (Positioning): Assistance needed to correctly position infant at breast and maintain latch. ? ?LATCH Score: 9 ? ? ?Lactation Tools Discussed/Used ?Tools: Coconut oil;Pump (Instructed mom on Harmony handpump.) ?Breast pump type: Manual ?Pump Education: Milk Storage;Setup, frequency, and cleaning ?Reason for Pumping: Baby has been bottle feeding and mom is going to transition baby to more breastfeeding. ?Pumping frequency: as needed if baby does not latch and feed ? ?Interventions ?Interventions: Breast feeding basics reviewed;Assisted with latch;Breast massage;Breast compression;Adjust position;Support pillows;Position options;Expressed milk;Coconut oil;Hand pump;Education ? ?Discharge ?Discharge Education:  Engorgement and breast care;Warning signs for feeding baby;Other (comment) (Reviewed Enjoy booklet. Discussed transitional plan to breastfeeding. As baby has been receiving mostly bottles recommended if baby is not calmed after breastfeed to offer pumped milk/ formula and as the mom's milk supply increases baby will require less) ?Pump:  (provided Harmony manual pump. Mom did not have a pump for home use.) ? ?Consult Status ?Consult Status: Complete ? ? ? ?Fuller Song ?05/06/2021, 5:06 PM ? ? ? ?

## 2021-05-06 NOTE — Discharge Summary (Signed)
?  Postpartum Discharge Summary ? ?Date of Service 05/06/21 ? ?   ?Patient Name: Shirley Hayes ?DOB: 04-04-1995 ?MRN: [id number] ? ?Date of admission: 05/05/2021 ?Delivery date:05/05/2021  ?Delivering provider: Lurlean Horns  ?Date of discharge: 05/06/2021 ? ?Admitting diagnosis: Labor and delivery, indication for care [O75.9] ?Intrauterine pregnancy: [redacted]w[redacted]d    ?Secondary diagnosis:  Active Problems: ?  Labor and delivery, indication for care ? ?Additional problems: none   ?Discharge diagnosis: Term Pregnancy Delivered and VBAC                                              ?Post partum procedures:rhogam ?Augmentation: Pitocin ?Complications: None ? ?Hospital course: Induction of Labor With Vaginal Delivery   ?26y.o. yo G4P4004 at 369w0das admitted to the hospital 05/05/2021 for induction of labor.  Indication for induction:  h/o gastric bypass .  Patient had an uncomplicated labor course as follows: ?Membrane Rupture Time/Date: 1:40 PM ,05/05/2021   ?Delivery Method:VBAC, Spontaneous  ?Episiotomy: None  ?Lacerations:  1st degree;Labial  ?Details of delivery can be found in separate delivery note.  Patient had a routine postpartum course. Patient is discharged home 05/06/21. ? ?Newborn Data: ?Birth date:05/05/2021  ?Birth time:7:41 PM  ?Gender:Female  ?Living status:Living  ?Apgars:8 ,9  ?Weight:3130 g  ? ?Magnesium Sulfate received: No ?BMZ received: No ?Rhophylac:Yes ?MMR:N/A ?T-DaP:Given prenatally ?Flu: N/A ?Transfusion:No ? ?Physical exam  ?Vitals:  ? 05/05/21 2321 05/06/21 0033 05/06/21 0315 05/06/21 0743  ?BP: 108/63 (!) 98/50 (!) 105/59 (!) 105/52  ?Pulse: 80 67 (!) 58 66  ?Resp: _0 ?Temp: 98.2 ?F (36.8 ?C) 98.7 ?F (37.1 ?C) 98 ?F (36.7 ?C) 98.6 ?F (37 ?C)  ?TempSrc: Oral Oral  Oral  ?SpO2: 98% 96% 100% 100%  ?Weight:      ?Height:      ? ?General: alert, cooperative, and no distress ?Lochia: appropriate ?Uterine Fundus: firm ?Laceration: Healing well with no significant drainage ?DVT Evaluation:  No evidence of DVT seen on physical exam. ?Labs: ?Lab Results  ?Component Value Date  ? WBC 5.7 05/05/2021  ? HGB 11.9 (L) 05/05/2021  ? HCT 34.3 (L) 05/05/2021  ? MCV 92.2 05/05/2021  ? PLT 160 05/05/2021  ? ?CMP Latest Ref Rng & Units 04/15/2021  ?Glucose 70 - 99 mg/dL 91  ?BUN 6 - 20 mg/dL 12  ?Creatinine 0.44 - 1.00 mg/dL 0.69  ?Sodium 135 - 145 mmol/L 134(L)  ?Potassium 3.5 - 5.1 mmol/L 3.9  ?Chloride 98 - 111 mmol/L 105  ?CO2 22 - 32 mmol/L 20(L)  ?Calcium 8.9 - 10.3 mg/dL 8.6(L)  ?Total Protein 6.5 - 8.1 g/dL 5.8(L)  ?Total Bilirubin 0.3 - 1.2 mg/dL 0.9  ?Alkaline Phos 38 - 126 U/L 155(H)  ?AST 15 - 41 U/L 19  ?ALT 0 - 44 U/L 18  ? ?Edinburgh Score: ?No flowsheet data found. ? ? ? ?After visit meds:  ?Allergies as of 05/06/2021   ? ?   Reactions  ? Chlorhexidine Gluconate Itching  ? "severe"  ? Tape Rash  ? ?  ? ?  ?Medication List  ?  ? ?TAKE these medications   ? ?FLUoxetine 40 MG capsule ?Commonly known as: PROzac ?Take 1 capsule (40 mg total) by mouth daily. ?  ?omeprazole 20 MG capsule ?Commonly known as: PRILOSEC ?Take 1 capsule by mouth daily. ?  ?prenatal vitamin  w/FE, FA 29-1 MG Chew chewable tablet ?Chew 1 tablet by mouth daily at 12 noon. ?  ? ?  ? ? ? ?Discharge home in stable condition ?Infant Feeding: Bottle and Breast ?Infant Disposition:home with mother ?Discharge instruction: per After Visit Summary and Postpartum booklet. ?Activity: Advance as tolerated. Pelvic rest for 6 weeks.  ?Diet: routine diet ?Anticipated Birth Control: Unsure ?Postpartum Appointment: 2 week video visit, 6 week office visit ?Additional Postpartum F/U:  PRN ? ? ? ? 05/06/2021 ?Lurlean Horns, CNM ? ? ?

## 2021-05-07 LAB — RHOGAM INJECTION: Unit division: 0

## 2021-05-10 ENCOUNTER — Encounter: Payer: Self-pay | Admitting: Certified Nurse Midwife

## 2021-05-10 ENCOUNTER — Other Ambulatory Visit: Payer: Self-pay

## 2021-05-10 DIAGNOSIS — Z8669 Personal history of other diseases of the nervous system and sense organs: Secondary | ICD-10-CM

## 2021-05-11 ENCOUNTER — Other Ambulatory Visit: Payer: Medicaid Other

## 2021-05-11 NOTE — Telephone Encounter (Signed)
Messaged relayed to pt., waiting for response.  ?

## 2021-05-12 ENCOUNTER — Encounter: Payer: Medicaid Other | Admitting: Certified Nurse Midwife

## 2021-06-25 ENCOUNTER — Ambulatory Visit (INDEPENDENT_AMBULATORY_CARE_PROVIDER_SITE_OTHER): Payer: Medicaid Other | Admitting: Obstetrics

## 2021-06-25 VITALS — BP 111/73 | HR 82 | Ht 67.0 in | Wt 189.5 lb

## 2021-06-25 DIAGNOSIS — Z30013 Encounter for initial prescription of injectable contraceptive: Secondary | ICD-10-CM

## 2021-06-25 DIAGNOSIS — R399 Unspecified symptoms and signs involving the genitourinary system: Secondary | ICD-10-CM

## 2021-06-25 DIAGNOSIS — Z8659 Personal history of other mental and behavioral disorders: Secondary | ICD-10-CM

## 2021-06-25 MED ORDER — MEDROXYPROGESTERONE ACETATE 150 MG/ML IM SUSP
150.0000 mg | Freq: Once | INTRAMUSCULAR | Status: AC
  Administered 2021-06-25: 150 mg via INTRAMUSCULAR

## 2021-06-25 NOTE — Progress Notes (Signed)
SUBJECTIVE ? ?Shirley Hayes is a 26 y.o. [phone] who gave birth to a fe/female infant at 58 weeks via NSVD on 05/05/21 with epidural anesthesia.  She had bilateral labial lacerations that were repaired. She is bottle feeding. She is here today for a PP follow-up visit. She has resumed intercourse and reports that there is some discomfort where her right labia was repaired. She is interested in getting an IUD for contraception. She would like to get a Depo shot today and then return in 10-12 weeks for the IUD placement. Her menses resumed 06/11/21. ? ?Reports rare alcohol use and daily vaping. Denies recreational drugs and tobacco. ? ?ROS ? ?Mood: Not feeling much depression, but has days when she feels "robotic." She feels this is due to the Prozac. She is not interested in trying other SSRIs at this time. Had some severe mood swings during her period. Good social support. ?Bowel function: Normal ?Bladder function: Normal ?Vaginal bleeding: None. Normal menses ?Pain: Some pain in right labia ? ?Denies difficulty breathing, chest pain, lower extremity pain or swelling, excessive vaginal bleeding, vaginal pain.  ? ?Infant: Doing well, gaining weight. Older siblings are enjoying her. ? ?OBJECTIVE ?Last Weight  Most recent update: 06/25/2021  2:03 PM  ? ? Weight  ?86 kg (189 lb 8 oz)  ?      ? ?  ? ?Body mass index is 29.68 kg/m?. ? ?BP 111/73   Pulse 82   Ht 5\' 7"  (1.702 m)   Wt 189 lb 8 oz (86 kg)   LMP 06/11/2021   Breastfeeding No   BMI 29.68 kg/m?  ?Chlorhexidine gluconate and Tape ? ?Past Medical/Surgical History ?Past Medical History:  ?Diagnosis Date  ? Anxiety   ? Asthma   ? physical induced asthma  ? Depression   ? GERD (gastroesophageal reflux disease)   ? Gestational diabetes   ? Hypothyroidism   ? Mental disorder   ? ?Past Surgical History:  ?Procedure Laterality Date  ? CESAREAN SECTION  [phone]  ? CESAREAN SECTION N/A 09/03/2015  ? Procedure: CESAREAN SECTION;  Surgeon: 09/05/2015, MD;  Location:  [facility] ORS;  Service: Obstetrics;  Laterality: N/A;  ? CHOLECYSTECTOMY N/A 01/01/2015  ? Procedure: LAPAROSCOPIC CHOLECYSTECTOMY WITH INTRAOPERATIVE CHOLANGIOGRAM, laparoscopic incisional hernia repair;  Surgeon: 13/11/2014, MD;  Location: [facility] ORS;  Service: General;  Laterality: N/A;  ? GASTRIC BYPASS  12/24/2019  ? LAPAROSCOPY  02/22/2012  ? ? ?Last Pap:  10/20/2020. Normal cytology. ? ?BP 111/73   Pulse 82   Ht 5\' 7"  (1.702 m)   Wt 189 lb 8 oz (86 kg)   LMP 06/11/2021   Breastfeeding No   BMI 29.68 kg/m?  ?General appearance: alert, cooperative, and appears stated age ?Head: Normocephalic, without obvious abnormality, atraumatic ?Eyes: negative findings: lids and lashes normal and conjunctivae and sclerae normal ?Neck: no adenopathy, supple, symmetrical, trachea midline, and thyroid not enlarged, symmetric, no tenderness/mass/nodules ?Lungs: clear to auscultation bilaterally ?Breasts:  declined ?Heart: regular rate and rhythm, S1, S2 normal, no murmur, click, rub or gallop ?Abdomen: soft, non-tender; bowel sounds normal; no masses,  no organomegaly and fundus not palpable ?Pelvic: external genitalia normal, vagina normal without discharge, and labia healing well. Area on right labia noted to not be healed but not approximated. ?Extremities: extremities normal, atraumatic, no cyanosis or edema ?Pulses: 2+ and symmetric ?Skin: Skin color, texture, turgor normal. No rashes or lesions ?Lymph nodes: Cervical, supraclavicular, and axillary nodes normal. ? ?ASSESSMENT ?1) 6 weeks PP ?2)  Desires contraception ?3) Depression ?4) Labial laceration not approximated ?  ?PLAN ?1) No activity restrictions. Discussed routine preventive care and health lifestyle choices. Encouraged decreasing/stopping vape. Needs dental work - looking for dentist. Has regular eye exams. ?2) Depo shot today. Will schedule IUD placement in 10-12 weeks. ?3) Discussed PP mood changes and coping strategies. Interested in referral to  counseling.  ?4) Will continue to monitor healing. If still painful, consider MD consult for revision. ? ?Return in 1 year for annual visit. ? ?Guadlupe Spanish, CNM  ?

## 2021-06-29 ENCOUNTER — Encounter: Payer: Self-pay | Admitting: Obstetrics

## 2021-06-29 ENCOUNTER — Other Ambulatory Visit: Payer: Self-pay | Admitting: Obstetrics

## 2021-06-29 LAB — URINE CULTURE

## 2021-06-29 MED ORDER — NITROFURANTOIN MONOHYD MACRO 100 MG PO CAPS: 100.0000 mg | ORAL_CAPSULE | Freq: Two times a day (BID) | ORAL | 0 refills | Status: DC

## 2021-09-03 ENCOUNTER — Encounter: Payer: Medicaid Other | Admitting: Obstetrics

## 2021-09-09 ENCOUNTER — Telehealth: Payer: Medicaid Other | Admitting: Family Medicine

## 2021-09-09 DIAGNOSIS — R3989 Other symptoms and signs involving the genitourinary system: Secondary | ICD-10-CM | POA: Diagnosis not present

## 2021-09-09 MED ORDER — NITROFURANTOIN MONOHYD MACRO 100 MG PO CAPS: 100.0000 mg | ORAL_CAPSULE | Freq: Two times a day (BID) | ORAL | 0 refills | Status: AC

## 2021-09-09 NOTE — Patient Instructions (Signed)
Urinary Tract Infection, Adult A urinary tract infection (UTI) is an infection of any part of the urinary tract. The urinary tract includes: The kidneys. The ureters. The bladder. The urethra. These organs make, store, and get rid of pee (urine) in the body. What are the causes? This infection is caused by germs (bacteria) in your genital area. These germs grow and cause swelling (inflammation) of your urinary tract. What increases the risk? The following factors may make you more likely to develop this condition: Using a small, thin tube (catheter) to drain pee. Not being able to control when you pee or poop (incontinence). Being female. If you are female, these things can increase the risk: Using these methods to prevent pregnancy: A medicine that kills sperm (spermicide). A device that blocks sperm (diaphragm). Having low levels of a female hormone (estrogen). Being pregnant. You are more likely to develop this condition if: You have genes that add to your risk. You are sexually active. You take antibiotic medicines. You have trouble peeing because of: A prostate that is bigger than normal, if you are female. A blockage in the part of your body that drains pee from the bladder. A kidney stone. A nerve condition that affects your bladder. Not getting enough to drink. Not peeing often enough. You have other conditions, such as: Diabetes. A weak disease-fighting system (immune system). Sickle cell disease. Gout. Injury of the spine. What are the signs or symptoms? Symptoms of this condition include: Needing to pee right away. Peeing small amounts often. Pain or burning when peeing. Blood in the pee. Pee that smells bad or not like normal. Trouble peeing. Pee that is cloudy. Fluid coming from the vagina, if you are female. Pain in the belly or lower back. Other symptoms include: Vomiting. Not feeling hungry. Feeling mixed up (confused). This may be the first symptom in  older adults. Being tired and grouchy (irritable). A fever. Watery poop (diarrhea). How is this treated? Taking antibiotic medicine. Taking other medicines. Drinking enough water. In some cases, you may need to see a specialist. Follow these instructions at home:  Medicines Take over-the-counter and prescription medicines only as told by your doctor. If you were prescribed an antibiotic medicine, take it as told by your doctor. Do not stop taking it even if you start to feel better. General instructions Make sure you: Pee until your bladder is empty. Do not hold pee for a long time. Empty your bladder after sex. Wipe from front to back after peeing or pooping if you are a female. Use each tissue one time when you wipe. Drink enough fluid to keep your pee pale yellow. Keep all follow-up visits. Contact a doctor if: You do not get better after 1-2 days. Your symptoms go away and then come back. Get help right away if: You have very bad back pain. You have very bad pain in your lower belly. You have a fever. You have chills. You feeling like you will vomit or you vomit. Summary A urinary tract infection (UTI) is an infection of any part of the urinary tract. This condition is caused by germs in your genital area. There are many risk factors for a UTI. Treatment includes antibiotic medicines. Drink enough fluid to keep your pee pale yellow. This information is not intended to replace advice given to you by your health care provider. Make sure you discuss any questions you have with your health care provider. Document Revised: 09/20/2019 Document Reviewed: 09/20/2019 Elsevier Patient Education    2023 Elsevier Inc.  

## 2021-09-09 NOTE — Progress Notes (Signed)
Virtual Visit Consent   Shirley Hayes, you are scheduled for a virtual visit with a [facility] provider today. Just as with appointments in the office, your consent must be obtained to participate. Your consent will be active for this visit and any virtual visit you may have with one of our providers in the next 365 days. If you have a MyChart account, a copy of this consent can be sent to you electronically.  As this is a virtual visit, video technology does not allow for your provider to perform a traditional examination. This may limit your provider's ability to fully assess your condition. If your provider identifies any concerns that need to be evaluated in person or the need to arrange testing (such as labs, EKG, etc.), we will make arrangements to do so. Although advances in technology are sophisticated, we cannot ensure that it will always work on either your end or our end. If the connection with a video visit is poor, the visit may have to be switched to a telephone visit. With either a video or telephone visit, we are not always able to ensure that we have a secure connection.  By engaging in this virtual visit, you consent to the provision of healthcare and authorize for your insurance to be billed (if applicable) for the services provided during this visit. Depending on your insurance coverage, you may receive a charge related to this service.  I need to obtain your verbal consent now. Are you willing to proceed with your visit today? MITA VALLO has provided verbal consent on 09/09/2021 for a virtual visit (video or telephone). Freddy Finner, NP  Date: 09/09/2021 12:29 PM  Virtual Visit via Video Note   I, Freddy Finner, connected with  LEMYA GREENWELL  ([id number], [birth year]) on 09/09/21 at 12:30 PM EDT by a video-enabled telemedicine application and verified that I am speaking with the correct person using two identifiers.  Location: Patient: Virtual Visit Location  Patient: Home Provider: Virtual Visit Location Provider: Home Office   I discussed the limitations of evaluation and management by telemedicine and the availability of in person appointments. The patient expressed understanding and agreed to proceed.    History of Present Illness: Shirley Hayes is a 26 y.o. who identifies as a female who was assigned female at birth, and is being seen today for UTI symptoms. PP since March. Not breastfeeding at this time. Does not feel she is pregnant either.  HPI: Urinary Tract Infection  This is a new problem. The current episode started yesterday. The problem occurs every urination. The problem has been gradually worsening. The quality of the pain is described as burning. The patient is experiencing no pain. There has been no fever. She is Sexually active. There is No history of pyelonephritis. Associated symptoms include frequency, nausea and urgency. Pertinent negatives include no chills, discharge, flank pain, hematuria, hesitancy, possible pregnancy, sweats or vomiting. She has tried nothing for the symptoms. The treatment provided no relief.    Problems:  Patient Active Problem List   Diagnosis Date Noted   Labor and delivery, indication for care 04/15/2021   Indication for care in labor and delivery, antepartum 02/11/2021   History of alpha-1-antitrypsin deficiency 10/20/2020   History of gestational diabetes 10/20/2020   History of gastric bypass 10/07/2020   S/P cesarean section 09/03/2015    Allergies:  Allergies  Allergen Reactions   Chlorhexidine Gluconate Itching    "severe"   Tape Rash  Medications:  Current Outpatient Medications:    FLUoxetine (PROZAC) 40 MG capsule, Take 1 capsule (40 mg total) by mouth daily., Disp: 90 capsule, Rfl: 3   nitrofurantoin, macrocrystal-monohydrate, (MACROBID) 100 MG capsule, Take 1 capsule (100 mg total) by mouth 2 (two) times daily., Disp: 14 capsule, Rfl: 0   omeprazole (PRILOSEC) 20 MG capsule,  Take 1 capsule by mouth daily. (Patient not taking: Reported on 06/25/2021), Disp: , Rfl:    prenatal vitamin w/FE, FA (NATACHEW) 29-1 MG CHEW chewable tablet, Chew 1 tablet by mouth daily at 12 noon., Disp: , Rfl:   Observations/Objective: Patient is well-developed, well-nourished in no acute distress.  Resting comfortably  at home.  Head is normocephalic, atraumatic.  No labored breathing.  Speech is clear and coherent with logical content.  Patient is alert and oriented at baseline.    Assessment and Plan: 1. Suspected UTI  - nitrofurantoin, macrocrystal-monohydrate, (MACROBID) 100 MG capsule; Take 1 capsule (100 mg total) by mouth 2 (two) times daily for 5 days.  Dispense: 10 capsule; Refill: 0  -education on UTI causes -prevention reviewed -hydration -completion of meds, follow up if worsen- strict in person symptoms reviewed  Past Medical, Surgical, Social History, Allergies, and Medications have been Reviewed.  Patient acknowledged agreement and understanding of the plan.    Reviewed side effects, risks and benefits of medication.      Follow Up Instructions: I discussed the assessment and treatment plan with the patient. The patient was provided an opportunity to ask questions and all were answered. The patient agreed with the plan and demonstrated an understanding of the instructions.  A copy of instructions were sent to the patient via MyChart unless otherwise noted below.     The patient was advised to call back or seek an in-person evaluation if the symptoms worsen or if the condition fails to improve as anticipated.  Time:  I spent 9 minutes with the patient via telehealth technology discussing the above problems/concerns.    Freddy Finner, NP

## 2021-09-10 ENCOUNTER — Encounter: Payer: Medicaid Other | Admitting: Obstetrics

## 2021-09-21 ENCOUNTER — Telehealth: Payer: Medicaid Other | Admitting: Physician Assistant

## 2021-09-21 DIAGNOSIS — F411 Generalized anxiety disorder: Secondary | ICD-10-CM | POA: Diagnosis not present

## 2021-09-21 MED ORDER — HYDROXYZINE PAMOATE 25 MG PO CAPS: 25.0000 mg | ORAL_CAPSULE | Freq: Three times a day (TID) | ORAL | 0 refills | Status: DC | PRN

## 2021-09-21 NOTE — Progress Notes (Signed)
Virtual Visit Consent   Shirley Hayes, you are scheduled for a virtual visit with a [facility] provider today. Just as with appointments in the office, your consent must be obtained to participate. Your consent will be active for this visit and any virtual visit you may have with one of our providers in the next 365 days. If you have a MyChart account, a copy of this consent can be sent to you electronically.  As this is a virtual visit, video technology does not allow for your provider to perform a traditional examination. This may limit your provider's ability to fully assess your condition. If your provider identifies any concerns that need to be evaluated in person or the need to arrange testing (such as labs, EKG, etc.), we will make arrangements to do so. Although advances in technology are sophisticated, we cannot ensure that it will always work on either your end or our end. If the connection with a video visit is poor, the visit may have to be switched to a telephone visit. With either a video or telephone visit, we are not always able to ensure that we have a secure connection.  By engaging in this virtual visit, you consent to the provision of healthcare and authorize for your insurance to be billed (if applicable) for the services provided during this visit. Depending on your insurance coverage, you may receive a charge related to this service.  I need to obtain your verbal consent now. Are you willing to proceed with your visit today? [name] has provided verbal consent on 09/21/2021 for a virtual visit (video or telephone). [name], [place]  Date: 09/21/2021 11:38 AM  Virtual Visit via Video Note   I, [name], connected with  Shirley Hayes  ([id number], 21-May-1995) on 09/21/21 at 11:15 AM EDT by a video-enabled telemedicine application and verified that I am speaking with the correct person using two identifiers.  Location: Patient: Virtual Visit  Location Patient: Home Provider: Virtual Visit Location Provider: Home Office   I discussed the limitations of evaluation and management by telemedicine and the availability of in person appointments. The patient expressed understanding and agreed to proceed.    History of Present Illness: Shirley Hayes is a 26 y.o. who identifies as a female who was assigned female at birth, and is being seen today for discussion regarding anxiety levels since having her child 5 months ago. Notes having some generalized anxiety along with nocturnal panic attacks -- wakes up feeling very anxious, chest tightness, nauseated. Harder to go back to sleep and get restful sleep. Notes she feels jittery/shaky all throughout the day. Notes some anhedonia and occasional depressed mood. Good relationship with her children and her infant. Notes history of body picking and this has recurred as well. Is continuing on her chronic Prozac 40 mg once daily. Is getting set up with a therapist but still awaiting first appointment.   Is not breastfeeding.    HPI: HPI  Problems:  Patient Active Problem List   Diagnosis Date Noted   Labor and delivery, indication for care 04/15/2021   Indication for care in labor and delivery, antepartum 02/11/2021   History of alpha-1-antitrypsin deficiency 10/20/2020   History of gestational diabetes 10/20/2020   History of gastric bypass 10/07/2020   S/P cesarean section 09/03/2015    Allergies:  Allergies  Allergen Reactions   Chlorhexidine Gluconate Itching    "severe"   Tape Rash   Medications:  Current Outpatient  Medications:    hydrOXYzine (VISTARIL) 25 MG capsule, Take 1 capsule (25 mg total) by mouth every 8 (eight) hours as needed., Disp: 30 capsule, Rfl: 0   FLUoxetine (PROZAC) 40 MG capsule, Take 1 capsule (40 mg total) by mouth daily., Disp: 90 capsule, Rfl: 3   omeprazole (PRILOSEC) 20 MG capsule, Take 1 capsule by mouth daily. (Patient not taking: Reported on  06/25/2021), Disp: , Rfl:    prenatal vitamin w/FE, FA (NATACHEW) 29-1 MG CHEW chewable tablet, Chew 1 tablet by mouth daily at 12 noon., Disp: , Rfl:   Observations/Objective: Patient is well-developed, well-nourished in no acute distress.  Resting comfortably at home.  Head is normocephalic, atraumatic.  No labored breathing. Speech is clear and coherent with logical content.  Patient is alert and oriented at baseline.   Assessment and Plan: 1. Anxiety state - hydrOXYzine (VISTARIL) 25 MG capsule; Take 1 capsule (25 mg total) by mouth every 8 (eight) hours as needed.  Dispense: 30 capsule; Refill: 0  Sleep hygiene practice, mindfulness training and deep breathing resources given. Follow-up with therapist as scheduled. Will add on Hydroxyzine for acute anxiety when needed. Continue Prozac. She is aware we do not start chronic medications for anxiety/depression via virtual urgent care and she needs to contact her PCP or GYN for ongoing management. She agrees to do this.   Follow Up Instructions: I discussed the assessment and treatment plan with the patient. The patient was provided an opportunity to ask questions and all were answered. The patient agreed with the plan and demonstrated an understanding of the instructions.  A copy of instructions were sent to the patient via MyChart unless otherwise noted below.   The patient was advised to call back or seek an in-person evaluation if the symptoms worsen or if the condition fails to improve as anticipated.  Time:  I spent 10 minutes with the patient via telehealth technology discussing the above problems/concerns.    [name], PA-C

## 2021-09-21 NOTE — Patient Instructions (Signed)
Shirley Hayes, thank you for joining [name], PA-C for today's virtual visit.  While this provider is not your primary care provider (PCP), if your PCP is located in our provider database this encounter information will be shared with them immediately following your visit.  Consent: (Patient) Shirley Hayes provided verbal consent for this virtual visit at the beginning of the encounter.  Current Medications:  Current Outpatient Medications:    hydrOXYzine (VISTARIL) 25 MG capsule, Take 1 capsule (25 mg total) by mouth every 8 (eight) hours as needed., Disp: 30 capsule, Rfl: 0   FLUoxetine (PROZAC) 40 MG capsule, Take 1 capsule (40 mg total) by mouth daily., Disp: 90 capsule, Rfl: 3   omeprazole (PRILOSEC) 20 MG capsule, Take 1 capsule by mouth daily. (Patient not taking: Reported on 06/25/2021), Disp: , Rfl:    prenatal vitamin w/FE, FA (NATACHEW) 29-1 MG CHEW chewable tablet, Chew 1 tablet by mouth daily at 12 noon., Disp: , Rfl:    Medications ordered in this encounter:  Meds ordered this encounter  Medications   hydrOXYzine (VISTARIL) 25 MG capsule    Sig: Take 1 capsule (25 mg total) by mouth every 8 (eight) hours as needed.    Dispense:  30 capsule    Refill:  0    Order Specific Question:   Supervising Provider    Answer:   Hyacinth Meeker, BRIAN [[id number]]     *If you need refills on other medications prior to your next appointment, please contact your pharmacy*  Follow-Up: Call back or seek an in-person evaluation if the symptoms worsen or if the condition fails to improve as anticipated.  Other Instructions Please keep up with your daily Prozac.  Use the Hydroxyzine as directed, when needed. Please look into the Calm app on your phone to start some mindfulness training for anxiety. Please follow-up with your therapist as scheduled. I do want you to call your PCP or GYN to get in for a follow-up for further medication management.   If you have any thoughts or harming  yourself or others, please seek care at nearest ER.   Sleep Hygiene  Do: (1) Go to bed at the same time each day. (2) Get up from bed at the same time each day. (3) Get regular exercise each day, preferably in the morning.  There is goof evidence that regular exercise improves restful sleep.  This includes stretching and aerobic exercise. (4) Get regular exposure to outdoor or bright lights, especially in the late afternoon. (5) Keep the temperature in your bedroom comfortable. (6) Keep the bedroom quiet when sleeping. (7) Keep the bedroom dark enough to facilitate sleep. (8) Use your bed only for sleep and sex. (9) Take medications as directed.  It is helpful to take prescribed sleeping pills 1 hour before bedtime, so they are causing drowsiness when you lie down, or 10 hours before getting up, to avoid daytime drowsiness. (10) Use a relaxation exercise just before going to sleep -- imagery, massage, warm bath. (11) Keep your feet and hands warm.  Wear warm socks and/or mittens or gloves to bed.  Don't: (1) Exercise just before going to bed. (2) Engage in stimulating activity just before bed, such as playing a competitive game, watching an exciting program on television, or having an important discussion with a loved one. (3) Have caffeine in the evening (coffee, teas, chocolate, sodas, etc.) (4) Read or watch television in bed. (5) Use alcohol to help you sleep. (6) Go to bed  too hungry or too full. (7) Take another person's sleeping pills. (8) Take over-the-counter sleeping pills, without your doctor's knowledge.  Tolerance can develop rapidly with these medications.  Diphenhydramine can have serious side effects for elderly patients. (9) Take daytime naps. (10) Command yourself to go to sleep.  This only makes your mind and body more alert.  If you lie awake for more than 20-30 minutes, get up, go to a different room, participate in a quiet activity (Ex - non-excitable reading or  television), and then return to bed when you feel sleepy.  Do this as many times during the night as needed.  This may cause you to have a night or two of poor sleep but it will train your brain to know when it is time for sleep.    If you have been instructed to have an in-person evaluation today at a local Urgent Care facility, please use the link below. It will take you to a list of all of our available [facility], including address, phone number and hours of operation. Please do not delay care.  [facility]  If you or a family member do not have a primary care provider, use the link below to schedule a visit and establish care. When you choose a [facility] primary care physician or advanced practice provider, you gain a long-term partner in health. Find a Primary Care Provider  Learn more about [facility]'s in-office and virtual care options:  - Get Care Now

## 2021-10-06 IMAGING — RF DG UGI W/ HIGH DENSITY W/O KUB
13 of 16 series · 14 of 24 positions shown · non-contrast
Comparison: Abdomen/pelvis CT 03/01/2019

CLINICAL DATA: Preoperative evaluation for sleeve gastrectomy.

EXAM:
UPPER GI SERIES WITH KUB
TECHNIQUE: After obtaining a scout radiograph a routine upper GI series was
performed using thin and high density barium.
FLUOROSCOPY TIME:  Fluoroscopy Time:  2 minutes and 54 seconds.
Radiation Exposure Index (if provided by the fluoroscopic device):
1,207 mGy
Number of Acquired Spot Images:

[Series 1: one shot · 0.14mm/px · 1 of 1 slices shown (1 of 2)]
[im 1/1]
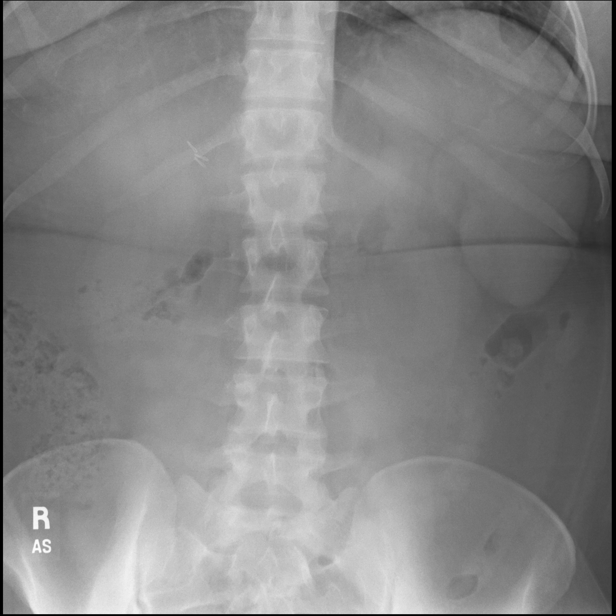

[Series 2: sequence · 0.29mm/px · 1 of 9 frames shown (1 of 11)]
[frame 8/9]
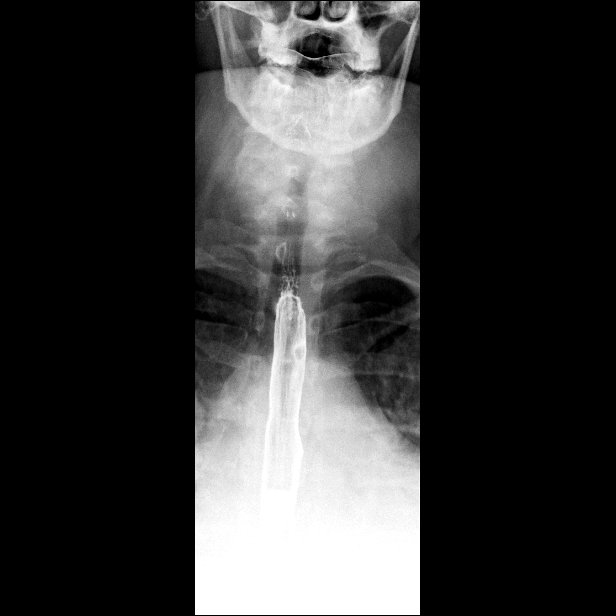

[Series 4: sequence · 0.29mm/px · 2 of 6 frames shown (2 of 11)]
[frame 1/6]
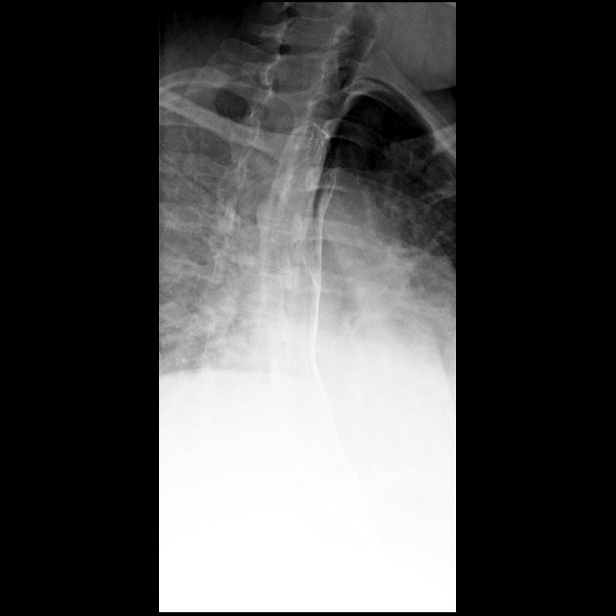
[frame 6/6]
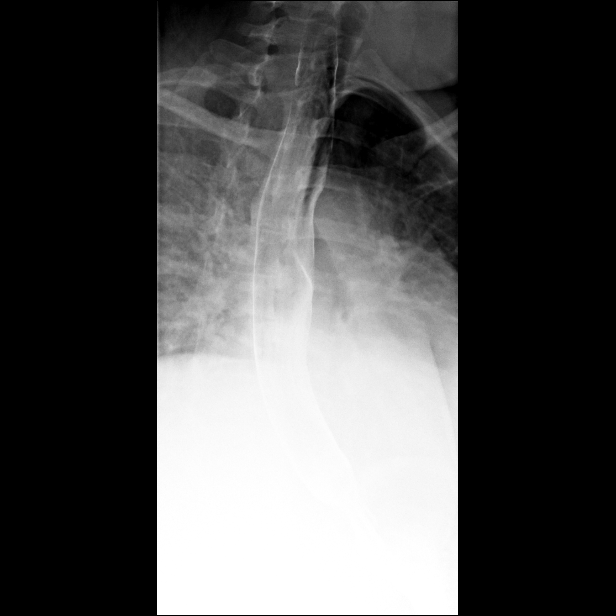

[Series 5: sequence · 0.29mm/px · 1 of 16 frames shown (3 of 11)]
[frame 9/16]
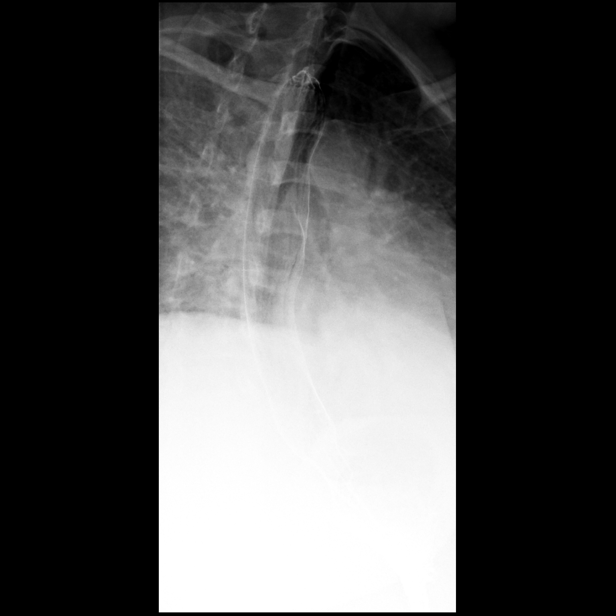

[Series 6: sequence · 0.29mm/px · 1 of 7 frames shown (4 of 11)]
[frame 4/7]
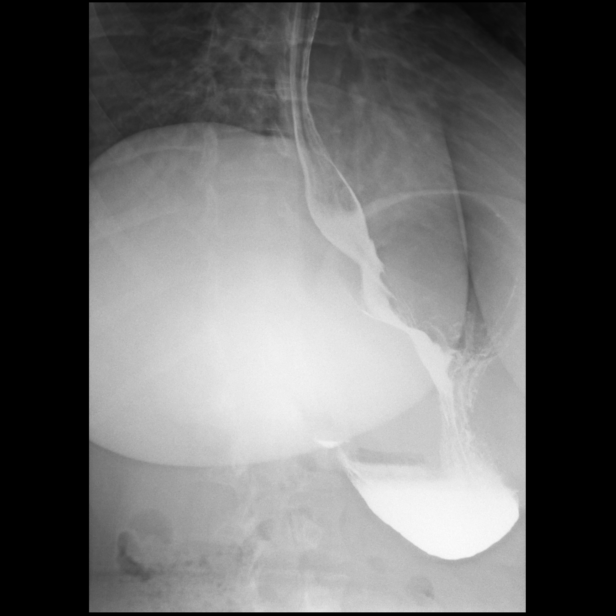

[Series 8: sequence · 0.29mm/px · 1 of 1 slices shown (5 of 11)]
[im 1/1]
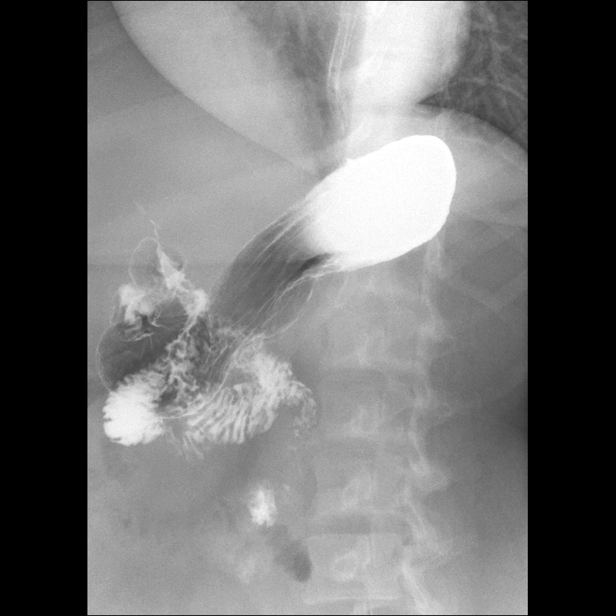

[Series 10: sequence · 0.29mm/px · 1 of 1 slices shown (6 of 11)]
[im 1/1]
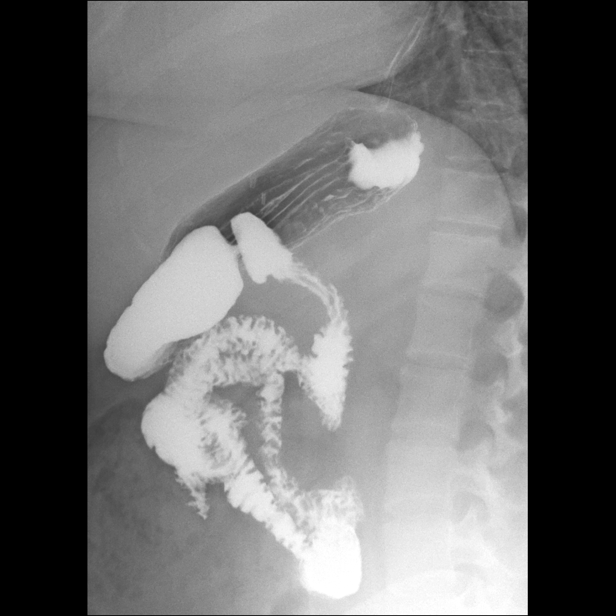

[Series 14: sequence · 0.29mm/px · 1 of 1 slices shown (7 of 11)]
[im 1/1]
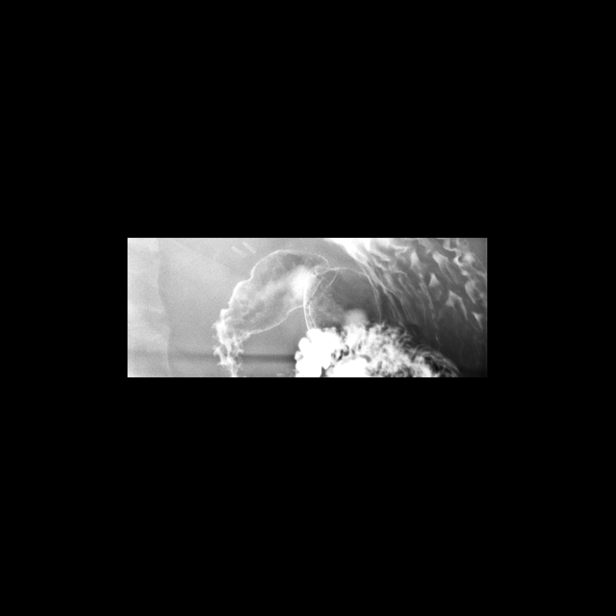

[Series 18: sequence · 0.29mm/px · 1 of 1 slices shown (8 of 11)]
[im 1/1]
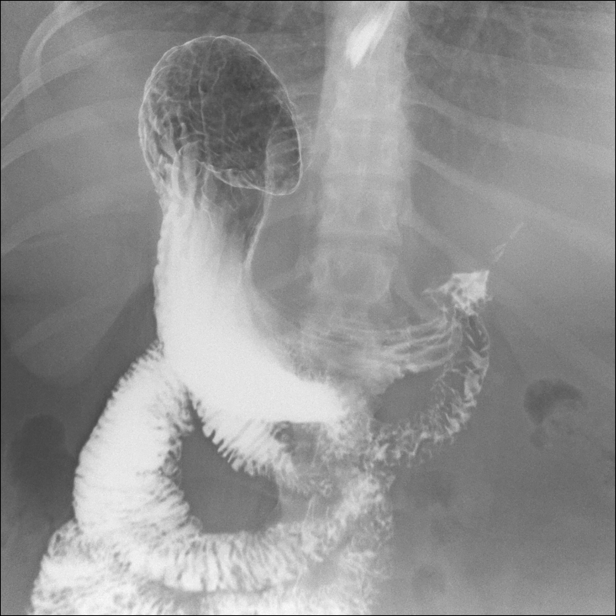

[Series 19: sequence · 1 of 27 frames shown (9 of 11)]
[frame 17/27]
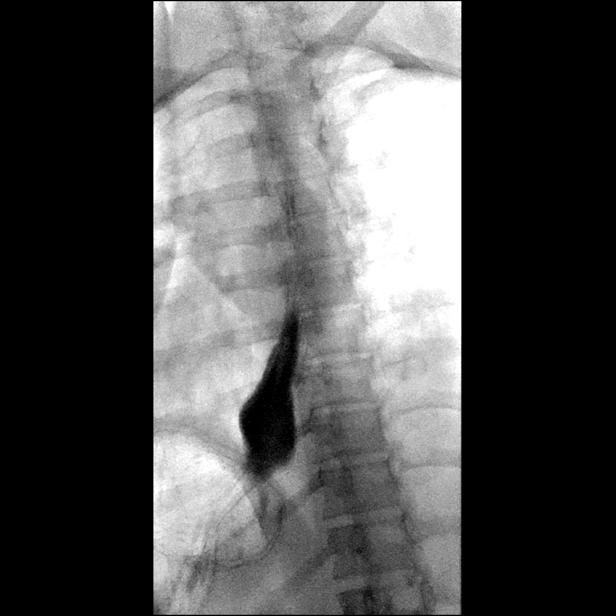

[Series 20: sequence · 1 of 66 frames shown (10 of 11)]
[frame 10/66]
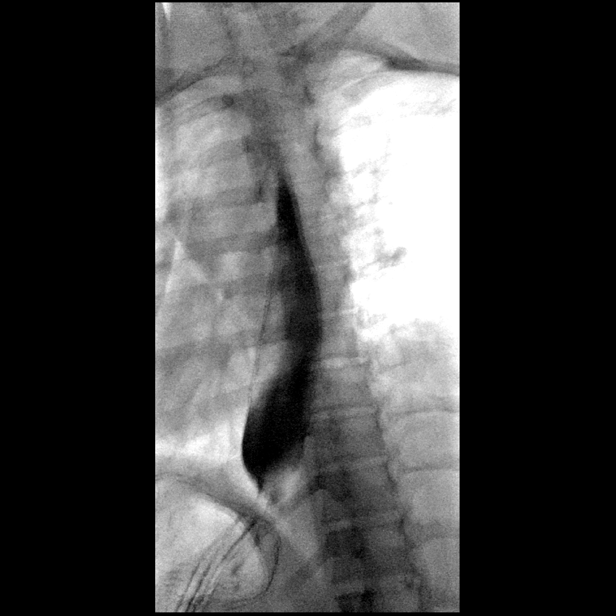

[Series 21: sequence · 1 of 49 frames shown (11 of 11)]
[frame 8/49]
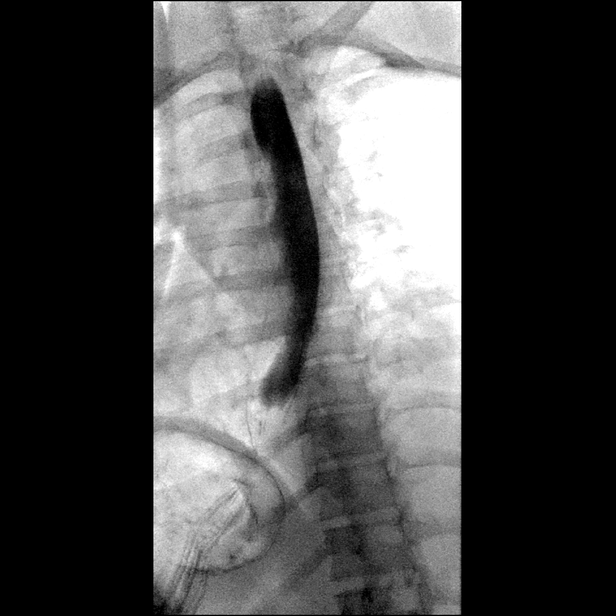

[Series 22: one shot · 1 of 1 slices shown (2 of 2)]
[im 1/1]
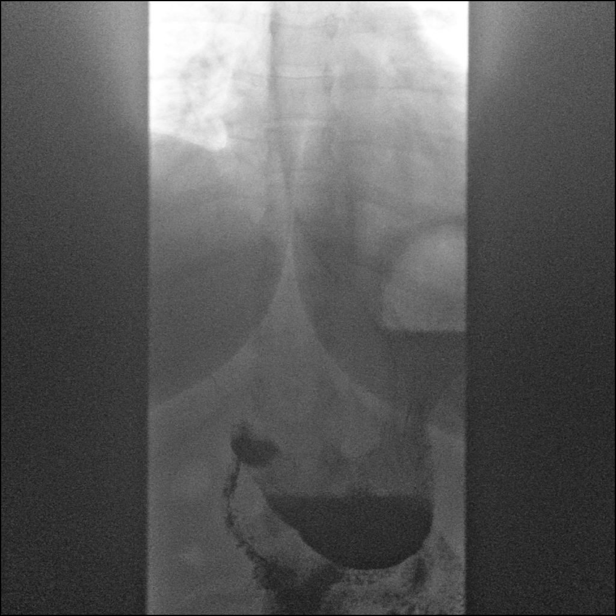

[14 of 24 positions shown; findings below may reference images not displayed]

FINDINGS: Pre-procedure KUB shows a normal bowel gas pattern.

Frontal and lateral views of the hypopharynx while swallowing thick
barium are unremarkable.

Double contrast imaging of the esophagus shows no stricture, mass
lesion, mucosal ulceration, or diverticulum. No evidence of hiatal
hernia.

Double contrast imaging of the stomach shows normal size and
position of the stomach. No gross mucosal ulceration or evidence of
mass lesion. Gastric emptying is prompt. Pylorus is normal. Single
and double contrast imaging of the duodenal bulb is normal. Duodenal
C-loop and ligament of Treitz are in expected anatomic location.

Patient was placed RAO on the fluoro table and monitored
fluoroscopically while swallowing. This shows good preservation of
primary peristalsis without tertiary contractions or
presbyesophagus. Multiple episodes of gastroesophageal reflux were
observed during this portion of the exam.
IMPRESSION: Episodes of gastroesophageal reflux observed during an otherwise
normal double contrast upper GI series.

## 2021-10-12 ENCOUNTER — Encounter: Payer: Self-pay | Admitting: Obstetrics

## 2021-10-12 ENCOUNTER — Ambulatory Visit (INDEPENDENT_AMBULATORY_CARE_PROVIDER_SITE_OTHER): Payer: Medicaid Other | Admitting: Obstetrics

## 2021-10-12 VITALS — BP 125/79 | HR 52 | Ht 62.0 in | Wt 181.0 lb

## 2021-10-12 DIAGNOSIS — Z3202 Encounter for pregnancy test, result negative: Secondary | ICD-10-CM | POA: Diagnosis not present

## 2021-10-12 DIAGNOSIS — Z30430 Encounter for insertion of intrauterine contraceptive device: Secondary | ICD-10-CM

## 2021-10-12 LAB — POCT URINE PREGNANCY: Preg Test, Ur: NEGATIVE

## 2021-10-12 MED ORDER — LEVONORGESTREL 20 MCG/DAY IU IUD
1.0000 | INTRAUTERINE_SYSTEM | Freq: Once | INTRAUTERINE | Status: AC
  Administered 2021-10-12: 1 via INTRAUTERINE

## 2021-10-12 NOTE — Progress Notes (Signed)
ENCOUNTER FOR IUD INSERTION   Subjective  Shirley Hayes is a 26 y.o. [phone] who presents today for IUD insertion. She desires reversible long-term contraception. We have thoroughly reviewed the risks, benefits, and alternatives, and she has elected to proceed with Mirena insertion.   Objective BP 125/79   Pulse (!) 52   Ht 5\' 2"  (1.575 m)   Wt 181 lb (82.1 kg)   LMP  (Approximate)   Breastfeeding Unknown   BMI 33.11 kg/m   UPT: negative  Procedure Note Consent was obtained prior to the procedure. A bimanual exam was performed to determine the position of the uterus. A sterile speculum was placed in the vagina, and the cervix was visualized. Betadine was applied to the cervix. A single-toothed tenaculum was placed on the anterior lip of the cervix, and gentle traction was applied to straighten and stabilize it. The uterus was sounded to about 8 cm. The IUD was inserted to the appropriate depth and the insertion tool was removed. The strings were trimmed to about 4 cm. Bleeding was minimal. [name] tolerated the procedure well. Post-procedure care and warning signs were reviewed with [name].  Follow up for annual visit or PRN.  , CNM

## 2021-10-13 ENCOUNTER — Encounter: Payer: Self-pay | Admitting: Obstetrics

## 2021-12-03 ENCOUNTER — Encounter: Payer: Self-pay | Admitting: Obstetrics

## 2022-06-01 ENCOUNTER — Telehealth: Payer: Self-pay

## 2022-06-01 NOTE — Telephone Encounter (Signed)
Fax refill request for FLUoxetine (PROZAC) 40 MG capsule Qty:90 Last fill: 03/05/22

## 2022-06-04 ENCOUNTER — Other Ambulatory Visit: Payer: Self-pay | Admitting: Obstetrics

## 2022-06-04 DIAGNOSIS — Z8659 Personal history of other mental and behavioral disorders: Secondary | ICD-10-CM

## 2022-06-04 MED ORDER — FLUOXETINE HCL 40 MG PO CAPS: 40.0000 mg | ORAL_CAPSULE | Freq: Every day | ORAL | 3 refills | Status: DC

## 2022-06-29 ENCOUNTER — Ambulatory Visit: Payer: Medicaid Other | Admitting: Nurse Practitioner

## 2022-06-29 ENCOUNTER — Encounter: Payer: Self-pay | Admitting: Nurse Practitioner

## 2022-06-29 VITALS — BP 130/76 | HR 47 | Ht 67.0 in | Wt 195.2 lb

## 2022-06-29 DIAGNOSIS — Z8659 Personal history of other mental and behavioral disorders: Secondary | ICD-10-CM | POA: Diagnosis not present

## 2022-06-29 DIAGNOSIS — R251 Tremor, unspecified: Secondary | ICD-10-CM

## 2022-06-29 DIAGNOSIS — F411 Generalized anxiety disorder: Secondary | ICD-10-CM | POA: Diagnosis not present

## 2022-06-29 MED ORDER — HYDROXYZINE PAMOATE 25 MG PO CAPS: 25.0000 mg | ORAL_CAPSULE | Freq: Three times a day (TID) | ORAL | 5 refills | Status: DC | PRN

## 2022-06-29 MED ORDER — BENZTROPINE MESYLATE 0.5 MG PO TABS: 0.5000 mg | ORAL_TABLET | Freq: Two times a day (BID) | ORAL | 2 refills | Status: DC

## 2022-06-29 MED ORDER — FLUOXETINE HCL 20 MG PO CAPS: 20.0000 mg | ORAL_CAPSULE | Freq: Every day | ORAL | 2 refills | Status: DC

## 2022-06-29 NOTE — Patient Instructions (Signed)
1) Decreasing prozac 20 mg daily 2) Refill hydroxyzine 3) Cogentin for tremors 4) Cards referral - CP 5) Follow up prn

## 2022-06-29 NOTE — Progress Notes (Signed)
Established Patient Office Visit  Subjective:  Patient ID: Shirley Hayes, female    DOB: 1995/06/11  Age: 27 y.o. MRN: [id number]  Chief Complaint  Patient presents with   Follow-up    Medication refills     Acute visit.  Needs med refills.  Tremors to both hands have been occurring for years, but is worsening.      No other concerns at this time.   Past Medical History:  Diagnosis Date   Anxiety    Asthma    physical induced asthma   Depression    GERD (gastroesophageal reflux disease)    Gestational diabetes    Hypothyroidism    Mental disorder     Past Surgical History:  Procedure Laterality Date   CESAREAN SECTION  [phone]   CESAREAN SECTION N/A 09/03/2015   Procedure: CESAREAN SECTION;  Surgeon: Vena Austria, MD;  Location: [facility] ORS;  Service: Obstetrics;  Laterality: N/A;   CHOLECYSTECTOMY N/A 01/01/2015   Procedure: LAPAROSCOPIC CHOLECYSTECTOMY WITH INTRAOPERATIVE CHOLANGIOGRAM, laparoscopic incisional hernia repair;  Surgeon: Kieth Brightly, MD;  Location: [facility] ORS;  Service: General;  Laterality: N/A;   GASTRIC BYPASS  12/24/2019   LAPAROSCOPY  02/22/2012    Social History   Socioeconomic History   Marital status: Married    Spouse name: Not on file   Number of children: Not on file   Years of education: Not on file   Highest education level: Not on file  Occupational History   Not on file  Tobacco Use   Smoking status: Former    Packs/day: 1.00    Years: 3.00    Additional pack years: 0.00    Total pack years: 3.00    Types: Cigarettes    Quit date: 07/22/2013    Years since quitting: 8.9   Smokeless tobacco: Never  Vaping Use   Vaping Use: Never used  Substance and Sexual Activity   Alcohol use: No    Alcohol/week: 0.0 standard drinks of alcohol   Drug use: No   Sexual activity: Yes    Birth control/protection: I.U.D.    Comment: mirena  Other Topics Concern   Not on file  Social History Narrative   Not on file    Social Determinants of Health   Financial Resource Strain: Not on file  Food Insecurity: Not on file  Transportation Needs: Not on file  Physical Activity: Not on file  Stress: Not on file  Social Connections: Not on file  Intimate Partner Violence: Not on file    Family History  Problem Relation Age of Onset   Diabetes Mother    Hypertension Mother    Hypertension Sister    Hypertension Maternal Grandmother    Hypertension Other    Breast cancer Neg Hx    Ovarian cancer Neg Hx    Colon cancer Neg Hx     Allergies  Allergen Reactions   Chlorhexidine Gluconate Itching    "severe"   Tape Rash    Review of Systems  Constitutional: Negative.   HENT: Negative.    Eyes: Negative.   Respiratory: Negative.    Cardiovascular: Negative.   Gastrointestinal: Negative.   Genitourinary: Negative.   Musculoskeletal: Negative.   Skin: Negative.   Neurological:  Positive for tremors.  Endo/Heme/Allergies: Negative.   Psychiatric/Behavioral:  The patient is nervous/anxious.        Objective:   Ht 5\' 7"  (1.702 m)   Wt 195 lb 3.2 oz (88.5 kg)   BMI 30.57  kg/m   Vitals:   06/29/22 0942  Height: 5\' 7"  (1.702 m)  Weight: 195 lb 3.2 oz (88.5 kg)  BMI (Calculated): 30.57    Physical Exam Vitals reviewed.  Constitutional:      Appearance: Normal appearance.  HENT:     Head: Normocephalic.     Nose: Nose normal.     Mouth/Throat:     Mouth: Mucous membranes are moist.  Eyes:     Pupils: Pupils are equal, round, and reactive to light.  Cardiovascular:     Rate and Rhythm: Normal rate and regular rhythm.     Pulses: Normal pulses.     Heart sounds: Normal heart sounds.  Pulmonary:     Effort: Pulmonary effort is normal.     Breath sounds: Normal breath sounds.  Abdominal:     General: Bowel sounds are normal.     Palpations: Abdomen is soft.  Musculoskeletal:        General: Normal range of motion.     Cervical back: Normal range of motion and neck supple.   Skin:    General: Skin is warm and dry.  Neurological:     Mental Status: She is alert and oriented to person, place, and time.  Psychiatric:        Mood and Affect: Mood normal.        Behavior: Behavior normal.      No results found for any visits on 06/29/22.  No results found for this or any previous visit (from the past 2160 hour(s)).    Assessment & Plan:   Problem List Items Addressed This Visit   None   No follow-ups on file.   Total time spent: 35 minutes  Orson Eva, NP  06/29/2022

## 2022-06-30 ENCOUNTER — Other Ambulatory Visit: Payer: Self-pay | Admitting: Nurse Practitioner

## 2022-06-30 MED ORDER — PHENTERMINE HCL 37.5 MG PO TABS: 37.5000 mg | ORAL_TABLET | Freq: Every day | ORAL | 2 refills | Status: DC

## 2022-07-01 ENCOUNTER — Encounter: Payer: Self-pay | Admitting: Obstetrics

## 2022-07-04 ENCOUNTER — Ambulatory Visit: Payer: Medicaid Other | Admitting: Obstetrics

## 2022-07-12 ENCOUNTER — Institutional Professional Consult (permissible substitution): Payer: Medicaid Other | Admitting: Cardiovascular Disease

## 2022-09-26 ENCOUNTER — Other Ambulatory Visit: Payer: Self-pay | Admitting: Nurse Practitioner

## 2022-10-20 ENCOUNTER — Telehealth: Payer: Self-pay

## 2022-10-20 ENCOUNTER — Other Ambulatory Visit: Payer: Self-pay

## 2022-10-20 MED ORDER — FLUOXETINE HCL 20 MG PO CAPS: 20.0000 mg | ORAL_CAPSULE | Freq: Every day | ORAL | 2 refills | Status: DC

## 2022-10-20 MED ORDER — BENZTROPINE MESYLATE 0.5 MG PO TABS: 0.5000 mg | ORAL_TABLET | Freq: Every day | ORAL | 2 refills | Status: DC

## 2022-10-20 NOTE — Telephone Encounter (Signed)
Pt was reached out to and Rx request was sent to [facility].

## 2023-01-29 ENCOUNTER — Other Ambulatory Visit: Payer: Self-pay | Admitting: Cardiology

## 2023-02-03 ENCOUNTER — Other Ambulatory Visit: Payer: Self-pay | Admitting: Cardiology

## 2023-02-06 ENCOUNTER — Telehealth: Payer: Self-pay

## 2023-03-27 ENCOUNTER — Telehealth: Payer: Medicaid Other | Admitting: Physician Assistant

## 2023-03-27 DIAGNOSIS — R3989 Other symptoms and signs involving the genitourinary system: Secondary | ICD-10-CM | POA: Diagnosis not present

## 2023-03-27 DIAGNOSIS — T3695XA Adverse effect of unspecified systemic antibiotic, initial encounter: Secondary | ICD-10-CM | POA: Diagnosis not present

## 2023-03-27 DIAGNOSIS — B379 Candidiasis, unspecified: Secondary | ICD-10-CM

## 2023-03-27 MED ORDER — NITROFURANTOIN MONOHYD MACRO 100 MG PO CAPS
100.0000 mg | ORAL_CAPSULE | Freq: Two times a day (BID) | ORAL | 0 refills | Status: DC
Start: 2023-03-27 — End: 2023-05-17

## 2023-03-27 MED ORDER — FLUCONAZOLE 150 MG PO TABS
150.0000 mg | ORAL_TABLET | ORAL | 0 refills | Status: DC | PRN
Start: 2023-03-27 — End: 2023-05-17

## 2023-03-27 NOTE — Progress Notes (Signed)
Virtual Visit Consent   Shirley Hayes, you are scheduled for a virtual visit with a [facility] provider today. Just as with appointments in the office, your consent must be obtained to participate. Your consent will be active for this visit and any virtual visit you may have with one of our providers in the next 365 days. If you have a MyChart account, a copy of this consent can be sent to you electronically.  As this is a virtual visit, video technology does not allow for your provider to perform a traditional examination. This may limit your provider's ability to fully assess your condition. If your provider identifies any concerns that need to be evaluated in person or the need to arrange testing (such as labs, EKG, etc.), we will make arrangements to do so. Although advances in technology are sophisticated, we cannot ensure that it will always work on either your end or our end. If the connection with a video visit is poor, the visit may have to be switched to a telephone visit. With either a video or telephone visit, we are not always able to ensure that we have a secure connection.  By engaging in this virtual visit, you consent to the provision of healthcare and authorize for your insurance to be billed (if applicable) for the services provided during this visit. Depending on your insurance coverage, you may receive a charge related to this service.  I need to obtain your verbal consent now. Are you willing to proceed with your visit today? Shirley Hayes has provided verbal consent on 03/27/2023 for a virtual visit (video or telephone). Shirley Loveless, PA-C  Date: 03/27/2023 12:19 PM  Virtual Visit via Video Note   IMargaretann Hayes, connected with  Shirley Hayes  ([id number], 11/17/1995) on 03/27/23 at 12:15 PM EST by a video-enabled telemedicine application and verified that I am speaking with the correct person using two identifiers.  Location: Patient: Virtual Visit  Location Patient: Home Provider: Virtual Visit Location Provider: Home Office   I discussed the limitations of evaluation and management by telemedicine and the availability of in person appointments. The patient expressed understanding and agreed to proceed.    History of Present Illness: Shirley Hayes is a 28 y.o. who identifies as a female who was assigned female at birth, and is being seen today for UTI symptoms.  HPI: Urinary Tract Infection  This is a new problem. The current episode started in the past 7 days (Saturday night into Sunday morning). The problem occurs every urination. The problem has been gradually worsening. The quality of the pain is described as burning and aching. The pain is moderate. There has been no fever. Associated symptoms include frequency, hesitancy and urgency. Pertinent negatives include no chills, discharge, flank pain, hematuria, nausea, possible pregnancy (mirena IUD) or vomiting. Associated symptoms comments: Suprapubic pressure, strong odor of urine. She has tried increased fluids, acetaminophen and NSAIDs [facility] emergency) for the symptoms. The treatment provided no relief. There is no history of recurrent UTIs.  At home test was positive for Leukocytes   Problems:  Patient Active Problem List   Diagnosis Date Noted   History of depression 06/29/2022   Anxiety state 06/29/2022   Tremor 06/29/2022   Labor and delivery, indication for care 04/15/2021   Indication for care in labor and delivery, antepartum 02/11/2021   History of alpha-1-antitrypsin deficiency 10/20/2020   History of gestational diabetes 10/20/2020   History of gastric bypass 10/07/2020  S/P cesarean section 09/03/2015    Allergies:  Allergies  Allergen Reactions   Chlorhexidine Gluconate Itching    "severe"   Tape Rash   Medications:  Current Outpatient Medications:    fluconazole (DIFLUCAN) 150 MG tablet, Take 1 tablet (150 mg total) by mouth every 3 (three) days as  needed., Disp: 2 tablet, Rfl: 0   nitrofurantoin, macrocrystal-monohydrate, (MACROBID) 100 MG capsule, Take 1 capsule (100 mg total) by mouth 2 (two) times daily., Disp: 10 capsule, Rfl: 0   phentermine (ADIPEX-P) 37.5 MG tablet, Take 1 tablet (37.5 mg total) by mouth daily before breakfast., Disp: 30 tablet, Rfl: 2   benztropine (COGENTIN) 0.5 MG tablet, Take 1 tablet (0.5 mg total) by mouth daily., Disp: 60 tablet, Rfl: 2   FLUoxetine (PROZAC) 20 MG capsule, TAKE 1 CAPSULE(20 MG) BY MOUTH DAILY, Disp: 30 capsule, Rfl: 2   hydrOXYzine (VISTARIL) 25 MG capsule, Take 1 capsule (25 mg total) by mouth every 8 (eight) hours as needed., Disp: 30 capsule, Rfl: 5   levonorgestrel (MIRENA, 52 MG,) 20 MCG/DAY IUD, 1 each by Intrauterine route once., Disp: , Rfl:    omeprazole (PRILOSEC) 20 MG capsule, Take 1 capsule by mouth daily. (Patient not taking: Reported on 06/25/2021), Disp: , Rfl:    prenatal vitamin w/FE, FA (NATACHEW) 29-1 MG CHEW chewable tablet, Chew 1 tablet by mouth daily at 12 noon. (Patient not taking: Reported on 06/29/2022), Disp: , Rfl:   Observations/Objective: Patient is well-developed, well-nourished in no acute distress.  Resting comfortably at home.  Head is normocephalic, atraumatic.  No labored breathing.  Speech is clear and coherent with logical content.  Patient is alert and oriented at baseline.    Assessment and Plan: 1. Suspected UTI (Primary) - nitrofurantoin, macrocrystal-monohydrate, (MACROBID) 100 MG capsule; Take 1 capsule (100 mg total) by mouth 2 (two) times daily.  Dispense: 10 capsule; Refill: 0  2. Antibiotic-induced yeast infection - fluconazole (DIFLUCAN) 150 MG tablet; Take 1 tablet (150 mg total) by mouth every 3 (three) days as needed.  Dispense: 2 tablet; Refill: 0  - Worsening symptoms.  - Will treat empirically with Macrobid - May use AZO for bladder spasms - Continue to push fluids.  - Diflucan given as prophylaxis as patient tends to get vaginal  yeast infections with antibiotic use. - Seek in person evaluation for urine culture if symptoms do not improve or if they worsen.    Follow Up Instructions: I discussed the assessment and treatment plan with the patient. The patient was provided an opportunity to ask questions and all were answered. The patient agreed with the plan and demonstrated an understanding of the instructions.  A copy of instructions were sent to the patient via MyChart unless otherwise noted below.    The patient was advised to call back or seek an in-person evaluation if the symptoms worsen or if the condition fails to improve as anticipated.    Shirley Loveless, PA-C

## 2023-03-27 NOTE — Patient Instructions (Signed)
Shirley Hayes, thank you for joining [name], PA-C for today's virtual visit.  While this provider is not your primary care provider (PCP), if your PCP is located in our provider database this encounter information will be shared with them immediately following your visit.   A [facility] MyChart account gives you access to today's visit and all your visits, tests, and labs performed at [facility] " click here if you don't have a [facility] MyChart account or go to [email or web address]  Consent: (Patient) Shirley Hayes provided verbal consent for this virtual visit at the beginning of the encounter.  Current Medications:  Current Outpatient Medications:    fluconazole (DIFLUCAN) 150 MG tablet, Take 1 tablet (150 mg total) by mouth every 3 (three) days as needed., Disp: 2 tablet, Rfl: 0   nitrofurantoin, macrocrystal-monohydrate, (MACROBID) 100 MG capsule, Take 1 capsule (100 mg total) by mouth 2 (two) times daily., Disp: 10 capsule, Rfl: 0   phentermine (ADIPEX-P) 37.5 MG tablet, Take 1 tablet (37.5 mg total) by mouth daily before breakfast., Disp: 30 tablet, Rfl: 2   benztropine (COGENTIN) 0.5 MG tablet, Take 1 tablet (0.5 mg total) by mouth daily., Disp: 60 tablet, Rfl: 2   FLUoxetine (PROZAC) 20 MG capsule, TAKE 1 CAPSULE(20 MG) BY MOUTH DAILY, Disp: 30 capsule, Rfl: 2   hydrOXYzine (VISTARIL) 25 MG capsule, Take 1 capsule (25 mg total) by mouth every 8 (eight) hours as needed., Disp: 30 capsule, Rfl: 5   levonorgestrel (MIRENA, 52 MG,) 20 MCG/DAY IUD, 1 each by Intrauterine route once., Disp: , Rfl:    omeprazole (PRILOSEC) 20 MG capsule, Take 1 capsule by mouth daily. (Patient not taking: Reported on 06/25/2021), Disp: , Rfl:    prenatal vitamin w/FE, FA (NATACHEW) 29-1 MG CHEW chewable tablet, Chew 1 tablet by mouth daily at 12 noon. (Patient not taking: Reported on 06/29/2022), Disp: , Rfl:    Medications ordered in this encounter:  Meds ordered this  encounter  Medications   nitrofurantoin, macrocrystal-monohydrate, (MACROBID) 100 MG capsule    Sig: Take 1 capsule (100 mg total) by mouth 2 (two) times daily.    Dispense:  10 capsule    Refill:  0    Supervising Provider:   Merrilee Jansky [[id number]]   fluconazole (DIFLUCAN) 150 MG tablet    Sig: Take 1 tablet (150 mg total) by mouth every 3 (three) days as needed.    Dispense:  2 tablet    Refill:  0    Supervising Provider:   Merrilee Jansky [[id number]]     *If you need refills on other medications prior to your next appointment, please contact your pharmacy*  Follow-Up: Call back or seek an in-person evaluation if the symptoms worsen or if the condition fails to improve as anticipated.  [facility] [phone]  Other Instructions  Urinary Tract Infection, Female A urinary tract infection (UTI) is an infection in your urinary tract. The urinary tract is made up of organs that make, store, and get rid of pee (urine) in your body. These organs include: The kidneys. The ureters. The bladder. The urethra. What are the causes? Most UTIs are caused by germs called bacteria. They may be in or near your genitals. These germs grow and cause swelling in your urinary tract. What increases the risk? You're more likely to get a UTI if: You're a female. The urethra is shorter in females than in males. You have a soft tube called a  catheter that drains your pee. You can't control when you pee or poop. You have trouble peeing because of: A kidney stone. A urinary blockage. A nerve condition that affects your bladder. Not getting enough to drink. You're sexually active. You use a birth control inside your vagina, like spermicide. You're pregnant. You have low levels of the hormone estrogen in your body. You're an older adult. You're also more likely to get a UTI if you have other health problems. These may include: Diabetes. A weak immune system. Your immune  system is your body's defense system. Sickle cell disease. Injury of the spine. What are the signs or symptoms? Symptoms may include: Needing to pee right away. Peeing small amounts often. Pain or burning when you pee. Blood in your pee. Pee that smells bad or odd. Pain in your belly or lower back. You may also: Feel confused. This may be the first symptom in older adults. Vomit. Not feel hungry. Feel tired or easily annoyed. Have a fever or chills. How is this diagnosed? A UTI is diagnosed based on your medical history and an exam. You may also have other tests. These may include: Pee tests. Blood tests. Tests for sexually transmitted infections (STIs). If you've had more than one UTI, you may need to have imaging studies done to find out why you keep getting them. How is this treated? A UTI can be treated by: Taking antibiotics or other medicines. Drinking enough fluid to keep your pee pale yellow. In rare cases, a UTI can cause a very bad condition called sepsis. Sepsis may be treated in the hospital. Follow these instructions at home: Medicines Take your medicines only as told by your health care provider. If you were given antibiotics, take them as told by your provider. Do not stop taking them even if you start to feel better. General instructions Make sure you: Pee often and fully. Do not hold your pee for a long time. Wipe from front to back after you pee or poop. Use each tissue only once when you wipe. Pee after you have sex. Do not douche or use sprays or powders in your genital area. Contact a health care provider if: Your symptoms don't get better after 1-2 days of taking antibiotics. Your symptoms go away and then come back. You have a fever or chills. You vomit or feel like you may vomit. Get help right away if: You have very bad pain in your back or lower belly. You faint. This information is not intended to replace advice given to you by your health care  provider. Make sure you discuss any questions you have with your health care provider. Document Revised: 09/15/2022 Document Reviewed: 05/13/2022 Elsevier Patient Education  2024 [employer]   If you have been instructed to have an in-person evaluation today at a local Urgent Care facility, please use the link below. It will take you to a list of all of our available [facility], including address, phone number and hours of operation. Please do not delay care.  [facility]  If you or a family member do not have a primary care provider, use the link below to schedule a visit and establish care. When you choose a [facility] primary care physician or advanced practice provider, you gain a long-term partner in health. Find a Primary Care Provider  Learn more about [facility]'s in-office and virtual care options: [facility] - Get Care Now

## 2023-04-05 ENCOUNTER — Encounter: Payer: Medicaid Other | Admitting: Adult Health

## 2023-05-15 ENCOUNTER — Other Ambulatory Visit: Payer: Self-pay | Admitting: Nurse Practitioner

## 2023-05-15 MED ORDER — FLUOXETINE HCL 20 MG PO CAPS: 20.0000 mg | ORAL_CAPSULE | Freq: Every day | ORAL | 2 refills | Status: DC

## 2023-05-17 ENCOUNTER — Ambulatory Visit: Admitting: Cardiology

## 2023-05-17 ENCOUNTER — Encounter: Payer: Self-pay | Admitting: Cardiology

## 2023-05-17 VITALS — BP 108/68 | HR 86 | Ht 67.0 in | Wt 210.8 lb

## 2023-05-17 DIAGNOSIS — R251 Tremor, unspecified: Secondary | ICD-10-CM

## 2023-05-17 DIAGNOSIS — Z8659 Personal history of other mental and behavioral disorders: Secondary | ICD-10-CM | POA: Diagnosis not present

## 2023-05-17 DIAGNOSIS — F411 Generalized anxiety disorder: Secondary | ICD-10-CM

## 2023-05-17 DIAGNOSIS — Z0130 Encounter for examination of blood pressure without abnormal findings: Secondary | ICD-10-CM

## 2023-05-17 MED ORDER — BENZTROPINE MESYLATE 1 MG PO TABS: 1.0000 mg | ORAL_TABLET | Freq: Two times a day (BID) | ORAL | 0 refills | Status: DC

## 2023-05-17 MED ORDER — ZEPBOUND 2.5 MG/0.5ML ~~LOC~~ SOAJ: 2.5000 mg | SUBCUTANEOUS | 3 refills | Status: DC

## 2023-05-17 NOTE — Progress Notes (Signed)
 Established Patient Office Visit  Subjective:  Patient ID: Shirley Hayes, female    DOB: Apr 01, 1995  Age: 28 y.o. MRN: [id number]  Chief Complaint  Patient presents with   Follow-up    Patient in office for regular follow up. Patient reports feeling well overall. States tremors are getting worse. Was started on cogentin 0.5 mg at visit in May 2024. Patient stopped it in November 2024, she didn't think it was working. Will send in cogentin 1 mg daily. Will send referral to neurology. Patient wanting to discuss weight loss. Had gastric bypass in 2021. Patient states she has gained some weight back. Has tried Phentermine, made tremors worse. Patient has a history of sleep apnea. Will send in Zepbound.     No other concerns at this time.   Past Medical History:  Diagnosis Date   Anxiety    Asthma    physical induced asthma   Depression    GERD (gastroesophageal reflux disease)    Gestational diabetes    Hypothyroidism    Mental disorder     Past Surgical History:  Procedure Laterality Date   CESAREAN SECTION  [phone]   CESAREAN SECTION N/A 09/03/2015   Procedure: CESAREAN SECTION;  Surgeon: Vena Austria, MD;  Location: [facility] ORS;  Service: Obstetrics;  Laterality: N/A;   CHOLECYSTECTOMY N/A 01/01/2015   Procedure: LAPAROSCOPIC CHOLECYSTECTOMY WITH INTRAOPERATIVE CHOLANGIOGRAM, laparoscopic incisional hernia repair;  Surgeon: Kieth Brightly, MD;  Location: [facility] ORS;  Service: General;  Laterality: N/A;   GASTRIC BYPASS  12/24/2019   LAPAROSCOPY  02/22/2012    Social History   Socioeconomic History   Marital status: Married    Spouse name: Not on file   Number of children: Not on file   Years of education: Not on file   Highest education level: Not on file  Occupational History   Not on file  Tobacco Use   Smoking status: Former    Current packs/day: 0.00    Average packs/day: 1 pack/day for 3.0 years (3.0 ttl pk-yrs)    Types: Cigarettes    Start date:  07/23/2010    Quit date: 07/22/2013    Years since quitting: 9.8   Smokeless tobacco: Never  Vaping Use   Vaping status: Never Used  Substance and Sexual Activity   Alcohol use: No    Alcohol/week: 0.0 standard drinks of alcohol   Drug use: No   Sexual activity: Yes    Birth control/protection: I.U.D.    Comment: mirena  Other Topics Concern   Not on file  Social History Narrative   Not on file   Social Drivers of Health   Financial Resource Strain: Not on file  Food Insecurity: Not on file  Transportation Needs: Not on file  Physical Activity: Not on file  Stress: Not on file  Social Connections: Not on file  Intimate Partner Violence: Not on file    Family History  Problem Relation Age of Onset   Diabetes Mother    Hypertension Mother    Hypertension Sister    Hypertension Maternal Grandmother    Hypertension Other    Breast cancer Neg Hx    Ovarian cancer Neg Hx    Colon cancer Neg Hx     Allergies  Allergen Reactions   Chlorhexidine Gluconate Itching    "severe"   Tape Rash    Outpatient Medications Prior to Visit  Medication Sig   FLUoxetine (PROZAC) 20 MG capsule Take 1 capsule (20 mg total) by mouth  daily.   levonorgestrel (MIRENA, 52 MG,) 20 MCG/DAY IUD 1 each by Intrauterine route once.   [DISCONTINUED] benztropine (COGENTIN) 0.5 MG tablet Take 1 tablet (0.5 mg total) by mouth daily. (Patient not taking: Reported on 05/17/2023)   [DISCONTINUED] fluconazole (DIFLUCAN) 150 MG tablet Take 1 tablet (150 mg total) by mouth every 3 (three) days as needed. (Patient not taking: Reported on 05/17/2023)   [DISCONTINUED] hydrOXYzine (VISTARIL) 25 MG capsule Take 1 capsule (25 mg total) by mouth every 8 (eight) hours as needed. (Patient not taking: Reported on 05/17/2023)   [DISCONTINUED] nitrofurantoin, macrocrystal-monohydrate, (MACROBID) 100 MG capsule Take 1 capsule (100 mg total) by mouth 2 (two) times daily. (Patient not taking: Reported on 05/17/2023)    [DISCONTINUED] omeprazole (PRILOSEC) 20 MG capsule Take 1 capsule by mouth daily. (Patient not taking: Reported on 05/17/2023)   [DISCONTINUED] phentermine (ADIPEX-P) 37.5 MG tablet Take 1 tablet (37.5 mg total) by mouth daily before breakfast. (Patient not taking: Reported on 05/17/2023)   [DISCONTINUED] prenatal vitamin w/FE, FA (NATACHEW) 29-1 MG CHEW chewable tablet Chew 1 tablet by mouth daily at 12 noon. (Patient not taking: Reported on 05/17/2023)   No facility-administered medications prior to visit.    Review of Systems  Constitutional: Negative.   HENT: Negative.    Eyes: Negative.   Respiratory: Negative.  Negative for shortness of breath.   Cardiovascular: Negative.  Negative for chest pain.  Gastrointestinal: Negative.  Negative for abdominal pain, constipation and diarrhea.  Genitourinary: Negative.   Musculoskeletal:  Negative for joint pain and myalgias.  Skin: Negative.   Neurological: Negative.  Negative for dizziness and headaches.  Endo/Heme/Allergies: Negative.   All other systems reviewed and are negative.      Objective:   BP 108/68   Pulse 86   Ht 5\' 7"  (1.702 m)   Wt 210 lb 12.8 oz (95.6 kg)   SpO2 99%   BMI 33.02 kg/m   Vitals:   05/17/23 0953  BP: 108/68  Pulse: 86  Height: 5\' 7"  (1.702 m)  Weight: 210 lb 12.8 oz (95.6 kg)  SpO2: 99%  BMI (Calculated): 33.01    Physical Exam Vitals and nursing note reviewed.  Constitutional:      Appearance: Normal appearance. She is normal weight.  HENT:     Head: Normocephalic and atraumatic.     Nose: Nose normal.     Mouth/Throat:     Mouth: Mucous membranes are moist.  Eyes:     Extraocular Movements: Extraocular movements intact.     Conjunctiva/sclera: Conjunctivae normal.     Pupils: Pupils are equal, round, and reactive to light.  Cardiovascular:     Rate and Rhythm: Normal rate and regular rhythm.     Pulses: Normal pulses.     Heart sounds: Normal heart sounds.  Pulmonary:     Effort:  Pulmonary effort is normal.     Breath sounds: Normal breath sounds.  Abdominal:     General: Abdomen is flat. Bowel sounds are normal.     Palpations: Abdomen is soft.  Musculoskeletal:        General: Normal range of motion.     Cervical back: Normal range of motion.  Skin:    General: Skin is warm and dry.  Neurological:     General: No focal deficit present.     Mental Status: She is alert and oriented to person, place, and time.  Psychiatric:        Mood and Affect: Mood normal.  Behavior: Behavior normal.        Thought Content: Thought content normal.        Judgment: Judgment normal.      No results found for any visits on 05/17/23.  No results found for this or any previous visit (from the past 2160 hours).    Assessment & Plan:  Cogentin 1 mg for tremors Referral sent to neurology Zepbound for weight loss  Problem List Items Addressed This Visit       Other   History of depression - Primary   Anxiety state   Tremor   Relevant Orders   Ambulatory referral to Neurology    Return in about 6 weeks (around 06/28/2023).   Total time spent: 25 minutes  [employer], NP  05/17/2023   This document may have been prepared by Dragon Voice Recognition software and as such may include unintentional dictation errors.

## 2023-05-23 ENCOUNTER — Encounter: Payer: Self-pay | Admitting: Cardiology

## 2023-05-24 ENCOUNTER — Other Ambulatory Visit: Payer: Self-pay | Admitting: Cardiology

## 2023-05-24 MED ORDER — WEGOVY 0.25 MG/0.5ML ~~LOC~~ SOAJ: 0.2500 mg | SUBCUTANEOUS | 3 refills | Status: DC

## 2023-06-28 ENCOUNTER — Ambulatory Visit: Admitting: Cardiology

## 2023-07-27 NOTE — Telephone Encounter (Signed)
 A user error has taken place: encounter opened in error, closed for administrative reasons.

## 2023-08-17 ENCOUNTER — Other Ambulatory Visit: Payer: Self-pay

## 2023-08-17 MED ORDER — FLUOXETINE HCL 20 MG PO CAPS: 20.0000 mg | ORAL_CAPSULE | Freq: Every day | ORAL | 2 refills | Status: DC

## 2023-08-17 MED ORDER — BENZTROPINE MESYLATE 1 MG PO TABS: 1.0000 mg | ORAL_TABLET | Freq: Two times a day (BID) | ORAL | 0 refills | Status: DC

## 2023-09-30 ENCOUNTER — Other Ambulatory Visit: Payer: Self-pay | Admitting: Cardiology

## 2023-10-02 ENCOUNTER — Telehealth: Payer: Self-pay

## 2023-10-02 NOTE — Telephone Encounter (Signed)
 Patient called stating that she had requested a medication refill and it was denied, need to call and follow up with that medication

## 2023-10-05 ENCOUNTER — Other Ambulatory Visit: Payer: Self-pay

## 2023-10-05 MED ORDER — BENZTROPINE MESYLATE 1 MG PO TABS: 1.0000 mg | ORAL_TABLET | Freq: Two times a day (BID) | ORAL | 0 refills | Status: AC

## 2023-10-05 MED ORDER — FLUOXETINE HCL 20 MG PO CAPS: 20.0000 mg | ORAL_CAPSULE | Freq: Every day | ORAL | 0 refills | Status: DC

## 2023-10-05 NOTE — Telephone Encounter (Signed)
 Spoke with patient needs prozac  and benztropine  sent in, amber approved for 30 days as long as pt make appt, pt informed and will call back to make appt

## 2023-11-14 ENCOUNTER — Other Ambulatory Visit: Payer: Self-pay | Admitting: Cardiology

## 2024-01-02 ENCOUNTER — Other Ambulatory Visit: Payer: Self-pay | Admitting: Cardiology

## 2024-01-26 ENCOUNTER — Ambulatory Visit: Admitting: Cardiology

## 2024-01-31 ENCOUNTER — Encounter: Payer: Self-pay | Admitting: Cardiology

## 2024-01-31 ENCOUNTER — Ambulatory Visit: Admitting: Cardiology

## 2024-01-31 VITALS — BP 106/76 | HR 76 | Ht 67.0 in | Wt 210.4 lb

## 2024-01-31 DIAGNOSIS — Z1329 Encounter for screening for other suspected endocrine disorder: Secondary | ICD-10-CM

## 2024-01-31 DIAGNOSIS — Z8632 Personal history of gestational diabetes: Secondary | ICD-10-CM

## 2024-01-31 DIAGNOSIS — R413 Other amnesia: Secondary | ICD-10-CM

## 2024-01-31 DIAGNOSIS — Z131 Encounter for screening for diabetes mellitus: Secondary | ICD-10-CM

## 2024-01-31 DIAGNOSIS — R41840 Attention and concentration deficit: Secondary | ICD-10-CM | POA: Insufficient documentation

## 2024-01-31 DIAGNOSIS — R52 Pain, unspecified: Secondary | ICD-10-CM

## 2024-01-31 DIAGNOSIS — G4733 Obstructive sleep apnea (adult) (pediatric): Secondary | ICD-10-CM

## 2024-01-31 DIAGNOSIS — Z9884 Bariatric surgery status: Secondary | ICD-10-CM

## 2024-01-31 DIAGNOSIS — Z0184 Encounter for antibody response examination: Secondary | ICD-10-CM

## 2024-01-31 DIAGNOSIS — R5383 Other fatigue: Secondary | ICD-10-CM

## 2024-01-31 DIAGNOSIS — Z13220 Encounter for screening for lipoid disorders: Secondary | ICD-10-CM

## 2024-01-31 NOTE — Progress Notes (Signed)
 Established Patient Office Visit  Subjective:  Patient ID: Shirley Hayes, female    DOB: 12/14/1995  Age: 28 y.o. MRN: [id number]  Chief Complaint  Patient presents with   Follow-up    Patient in office for regular follow up. Patient has a few complaints today. Patient requesting to be tested for ADHD. Will send referral to behavioral health.  Patient due for lab work, will do today. Patient starting nursing school soon, needs proof of immunizations, MMR, hep b. Will do today. Patient concerned she may have fibromyalgia. Patient complains of intermittent body aches. Will do lab work today to rule out other etiologies. Patient also complains of memory loss.     No other concerns at this time.   Past Medical History:  Diagnosis Date   Anxiety    Asthma    physical induced asthma   Depression    GERD (gastroesophageal reflux disease)    Gestational diabetes    Hypothyroidism    Indication for care in labor and delivery, antepartum 02/11/2021   Mental disorder    S/P cesarean section 09/03/2015    Past Surgical History:  Procedure Laterality Date   CESAREAN SECTION  [id number]   CESAREAN SECTION N/A 09/03/2015   Procedure: CESAREAN SECTION;  Surgeon: Glory High, MD;  Location: [facility]S;  Service: Obstetrics;  Laterality: N/A;   CHOLECYSTECTOMY N/A 01/01/2015   Procedure: LAPAROSCOPIC CHOLECYSTECTOMY WITH INTRAOPERATIVE CHOLANGIOGRAM, laparoscopic incisional hernia repair;  Surgeon: Louanne KANDICE Muse, MD;  Location: [facility] ORS;  Service: General;  Laterality: N/A;   GASTRIC BYPASS  12/24/2019   LAPAROSCOPY  02/22/2012    Social History   Socioeconomic History   Marital status: Married    Spouse name: Not on file   Number of children: Not on file   Years of education: Not on file   Highest education level: Not on file  Occupational History   Not on file  Tobacco Use   Smoking status: Former    Current packs/day: 0.00    Average packs/day: 1 pack/day for  3.0 years (3.0 ttl pk-yrs)    Types: Cigarettes    Start date: 07/23/2010    Quit date: 07/22/2013    Years since quitting: 10.5   Smokeless tobacco: Never  Vaping Use   Vaping status: Never Used  Substance and Sexual Activity   Alcohol use: No    Alcohol/week: 0.0 standard drinks of alcohol   Drug use: No   Sexual activity: Yes    Birth control/protection: I.U.D.    Comment: mirena   Other Topics Concern   Not on file  Social History Narrative   Not on file   Social Drivers of Health   Tobacco Use: Medium Risk (01/31/2024)   Patient History    Smoking Tobacco Use: Former    Smokeless Tobacco Use: Never    Passive Exposure: Not on Actuary Strain: Not on file  Food Insecurity: Not on file  Transportation Needs: Not on file  Physical Activity: Not on file  Stress: Not on file  Social Connections: Not on file  Intimate Partner Violence: Not on file  Depression (PHQ2-9): Low Risk (05/03/2021)   Depression (PHQ2-9)    PHQ-2 Score: 4  Alcohol Screen: Not on file  Housing: Not on file  Utilities: Not on file  Health Literacy: Not on file    Family History  Problem Relation Age of Onset   Diabetes Mother    Hypertension Mother    Hypertension Sister  Hypertension Maternal Grandmother    Hypertension Other    Breast cancer Neg Hx    Ovarian cancer Neg Hx    Colon cancer Neg Hx     Allergies  Allergen Reactions   Chlorhexidine Gluconate Itching    severe   Tape Rash    Outpatient Medications Prior to Visit  Medication Sig   benztropine  (COGENTIN ) 1 MG tablet Take 1 tablet (1 mg total) by mouth 2 (two) times daily. (Patient taking differently: Take 1 mg by mouth as needed.)   levonorgestrel  (MIRENA , 52 MG,) 20 MCG/DAY IUD 1 each by Intrauterine route once.   [DISCONTINUED] FLUoxetine  (PROZAC ) 20 MG capsule TAKE 1 CAPSULE(20 MG) BY MOUTH DAILY   [DISCONTINUED] Semaglutide -Weight Management (WEGOVY ) 0.25 MG/0.5ML SOAJ Inject 0.25 mg into the skin  once a week. (Patient not taking: Reported on 01/31/2024)   No facility-administered medications prior to visit.    Review of Systems  Constitutional: Negative.   HENT: Negative.    Eyes: Negative.   Respiratory: Negative.  Negative for shortness of breath.   Cardiovascular: Negative.  Negative for chest pain.  Gastrointestinal: Negative.  Negative for abdominal pain, constipation and diarrhea.  Genitourinary: Negative.   Musculoskeletal:  Positive for back pain. Negative for joint pain and myalgias.  Skin: Negative.   Neurological: Negative.  Negative for dizziness and headaches.  Endo/Heme/Allergies: Negative.   Psychiatric/Behavioral:  Positive for memory loss.   All other systems reviewed and are negative.      Objective:   BP 106/76   Pulse 76   Ht 5' 7 (1.702 m)   Wt 210 lb 6.4 oz (95.4 kg)   SpO2 99%   BMI 32.95 kg/m   Vitals:   01/31/24 1107  BP: 106/76  Pulse: 76  Height: 5' 7 (1.702 m)  Weight: 210 lb 6.4 oz (95.4 kg)  SpO2: 99%  BMI (Calculated): 32.95    Physical Exam Vitals and nursing note reviewed.  Constitutional:      Appearance: Normal appearance. She is normal weight.  HENT:     Head: Normocephalic and atraumatic.     Nose: Nose normal.     Mouth/Throat:     Mouth: Mucous membranes are moist.  Eyes:     Extraocular Movements: Extraocular movements intact.     Conjunctiva/sclera: Conjunctivae normal.     Pupils: Pupils are equal, round, and reactive to light.  Cardiovascular:     Rate and Rhythm: Normal rate and regular rhythm.     Pulses: Normal pulses.     Heart sounds: Normal heart sounds.  Pulmonary:     Effort: Pulmonary effort is normal.     Breath sounds: Normal breath sounds.  Abdominal:     General: Abdomen is flat. Bowel sounds are normal.     Palpations: Abdomen is soft.  Musculoskeletal:        General: Normal range of motion.     Cervical back: Normal range of motion.  Skin:    General: Skin is warm and dry.   Neurological:     General: No focal deficit present.     Mental Status: She is alert and oriented to person, place, and time.  Psychiatric:        Mood and Affect: Mood normal.        Behavior: Behavior normal.        Thought Content: Thought content normal.        Judgment: Judgment normal.      Results for orders placed  or performed in visit on 01/31/24  CMP14+EGFR  Result Value Ref Range   Glucose 89 70 - 99 mg/dL   BUN 10 6 - 20 mg/dL   Creatinine, Ser 9.17 0.57 - 1.00 mg/dL   eGFR 99 >40 fO/fpw/8.26   BUN/Creatinine Ratio 12 9 - 23   Sodium 137 134 - 144 mmol/L   Potassium 4.4 3.5 - 5.2 mmol/L   Chloride 105 96 - 106 mmol/L   CO2 19 (L) 20 - 29 mmol/L   Calcium 9.1 8.7 - 10.2 mg/dL   Total Protein 6.7 6.0 - 8.5 g/dL   Albumin 4.3 4.0 - 5.0 g/dL   Globulin, Total 2.4 1.5 - 4.5 g/dL   Bilirubin Total 0.5 0.0 - 1.2 mg/dL   Alkaline Phosphatase 170 (H) 41 - 116 IU/L   AST 31 0 - 40 IU/L   ALT 23 0 - 32 IU/L  Lipid Profile  Result Value Ref Range   Cholesterol, Total 161 100 - 199 mg/dL   Triglycerides 90 0 - 149 mg/dL   HDL 61 >60 mg/dL   VLDL Cholesterol Cal 17 5 - 40 mg/dL   LDL Chol Calc (NIH) 83 0 - 99 mg/dL   Chol/HDL Ratio 2.6 0.0 - 4.4 ratio  Hemoglobin A1c  Result Value Ref Range   Hgb A1c MFr Bld 4.6 (L) 4.8 - 5.6 %   Est. average glucose Bld gHb Est-mCnc 85 mg/dL  TSH  Result Value Ref Range   TSH 3.110 0.450 - 4.500 uIU/mL  CBC with Differential/Platelet  Result Value Ref Range   WBC 5.1 3.4 - 10.8 x10E3/uL   RBC 4.22 3.77 - 5.28 x10E6/uL   Hemoglobin 13.0 11.1 - 15.9 g/dL   Hematocrit 60.7 65.9 - 46.6 %   MCV 93 79 - 97 fL   MCH 30.8 26.6 - 33.0 pg   MCHC 33.2 31.5 - 35.7 g/dL   RDW 88.1 88.2 - 84.5 %   Platelets 216 150 - 450 x10E3/uL   Neutrophils 66 Not Estab. %   Lymphs 28 Not Estab. %   Monocytes 6 Not Estab. %   Eos 0 Not Estab. %   Basos 0 Not Estab. %   Neutrophils Absolute 3.4 1.4 - 7.0 x10E3/uL   Lymphocytes Absolute 1.5 0.7 -  3.1 x10E3/uL   Monocytes Absolute 0.3 0.1 - 0.9 x10E3/uL   EOS (ABSOLUTE) 0.0 0.0 - 0.4 x10E3/uL   Basophils Absolute 0.0 0.0 - 0.2 x10E3/uL   Immature Granulocytes 0 Not Estab. %   Immature Grans (Abs) 0.0 0.0 - 0.1 x10E3/uL  Vitamin B12  Result Value Ref Range   Vitamin B-12 588 232 - 1,245 pg/mL  Vitamin D  (25 hydroxy)  Result Value Ref Range   Vit D, 25-Hydroxy 16.7 (L) 30.0 - 100.0 ng/mL  CRP (C-Reactive Protein)  Result Value Ref Range   CRP <1 0 - 10 mg/L  ANA, IFA (with reflex)  Result Value Ref Range   ANA Titer 1 Negative   Rheumatoid Arthritis Profile  Result Value Ref Range   Rheumatoid fact SerPl-aCnc <10.0 <14.0 IU/mL   Cyclic Citrullin Peptide Ab 7 0 - 19 units  Hepatitis B Surface Antibody  Result Value Ref Range   Hep B Surface Ab, Qual Non Reactive   Measles/Mumps/Rubella Immunity  Result Value Ref Range   Rubella Antibodies, IGG 1.10 Immune >0.99 index   RUBEOLA AB, IGG >300.0 Immune >16.4 AU/mL   MUMPS ABS, IGG 15.0 Immune >10.9 AU/mL    Recent Results (  from the past 2160 hours)  CMP14+EGFR     Status: Abnormal   Collection Time: 01/31/24 11:31 AM  Result Value Ref Range   Glucose 89 70 - 99 mg/dL   BUN 10 6 - 20 mg/dL   Creatinine, Ser 9.17 0.57 - 1.00 mg/dL   eGFR 99 >40 fO/fpw/8.26   BUN/Creatinine Ratio 12 9 - 23   Sodium 137 134 - 144 mmol/L   Potassium 4.4 3.5 - 5.2 mmol/L   Chloride 105 96 - 106 mmol/L   CO2 19 (L) 20 - 29 mmol/L   Calcium 9.1 8.7 - 10.2 mg/dL   Total Protein 6.7 6.0 - 8.5 g/dL   Albumin 4.3 4.0 - 5.0 g/dL   Globulin, Total 2.4 1.5 - 4.5 g/dL   Bilirubin Total 0.5 0.0 - 1.2 mg/dL   Alkaline Phosphatase 170 (H) 41 - 116 IU/L   AST 31 0 - 40 IU/L   ALT 23 0 - 32 IU/L  Lipid Profile     Status: None   Collection Time: 01/31/24 11:31 AM  Result Value Ref Range   Cholesterol, Total 161 100 - 199 mg/dL   Triglycerides 90 0 - 149 mg/dL   HDL 61 >60 mg/dL   VLDL Cholesterol Cal 17 5 - 40 mg/dL   LDL Chol Calc (NIH) 83 0 -  99 mg/dL   Chol/HDL Ratio 2.6 0.0 - 4.4 ratio    Comment:                                   T. Chol/HDL Ratio                                             Men  Women                               1/2 Avg.Risk  3.4    3.3                                   Avg.Risk  5.0    4.4                                2X Avg.Risk  9.6    7.1                                3X Avg.Risk 23.4   11.0   Hemoglobin A1c     Status: Abnormal   Collection Time: 01/31/24 11:31 AM  Result Value Ref Range   Hgb A1c MFr Bld 4.6 (L) 4.8 - 5.6 %    Comment:          Prediabetes: 5.7 - 6.4          Diabetes: >6.4          Glycemic control for adults with diabetes: <7.0    Est. average glucose Bld gHb Est-mCnc 85 mg/dL  TSH     Status: None   Collection Time: 01/31/24 11:31 AM  Result Value Ref Range   TSH 3.110 0.450 - 4.500  uIU/mL  CBC with Differential/Platelet     Status: None   Collection Time: 01/31/24 11:31 AM  Result Value Ref Range   WBC 5.1 3.4 - 10.8 x10E3/uL   RBC 4.22 3.77 - 5.28 x10E6/uL   Hemoglobin 13.0 11.1 - 15.9 g/dL   Hematocrit 60.7 65.9 - 46.6 %   MCV 93 79 - 97 fL   MCH 30.8 26.6 - 33.0 pg   MCHC 33.2 31.5 - 35.7 g/dL   RDW 88.1 88.2 - 84.5 %   Platelets 216 150 - 450 x10E3/uL   Neutrophils 66 Not Estab. %   Lymphs 28 Not Estab. %   Monocytes 6 Not Estab. %   Eos 0 Not Estab. %   Basos 0 Not Estab. %   Neutrophils Absolute 3.4 1.4 - 7.0 x10E3/uL   Lymphocytes Absolute 1.5 0.7 - 3.1 x10E3/uL   Monocytes Absolute 0.3 0.1 - 0.9 x10E3/uL   EOS (ABSOLUTE) 0.0 0.0 - 0.4 x10E3/uL   Basophils Absolute 0.0 0.0 - 0.2 x10E3/uL   Immature Granulocytes 0 Not Estab. %   Immature Grans (Abs) 0.0 0.0 - 0.1 x10E3/uL  Vitamin B12     Status: None   Collection Time: 01/31/24 11:31 AM  Result Value Ref Range   Vitamin B-12 588 232 - 1,245 pg/mL  Vitamin D  (25 hydroxy)     Status: Abnormal   Collection Time: 01/31/24 11:31 AM  Result Value Ref Range   Vit D, 25-Hydroxy 16.7 (L) 30.0 - 100.0  ng/mL    Comment: Vitamin D  deficiency has been defined by the Institute of Medicine and an Endocrine Society practice guideline as a level of serum 25-OH vitamin D  less than 20 ng/mL (1,2). The Endocrine Society went on to further define vitamin D  insufficiency as a level between 21 and 29 ng/mL (2). 1. IOM (Institute of Medicine). 2010. Dietary reference    intakes for calcium and D. [place]: The    [employer]. 2. Holick MF, Binkley , Bischoff-Ferrari HA, et al.    Evaluation, treatment, and prevention of vitamin D     deficiency: an Endocrine Society clinical practice    guideline. JCEM. 2011 Jul; 96(7):1911-30.   CRP (C-Reactive Protein)     Status: None   Collection Time: 01/31/24 11:31 AM  Result Value Ref Range   CRP <1 0 - 10 mg/L  ANA, IFA (with reflex)     Status: None   Collection Time: 01/31/24 11:31 AM  Result Value Ref Range   ANA Titer 1 Negative     Comment:                                      Negative   <1:80                                      Borderline  1:80                                      Positive   >1:80 ICAP nomenclature: AC-0 For more information about Hep-2 cell patterns use ANApatterns.org, the official website for the International Consensus on Antinuclear Antibody (ANA) Patterns (ICAP).   Rheumatoid Arthritis Profile     Status: None   Collection Time:  01/31/24 11:31 AM  Result Value Ref Range   Rheumatoid fact SerPl-aCnc <10.0 <14.0 IU/mL   Cyclic Citrullin Peptide Ab 7 0 - 19 units    Comment:                           Negative               <20                           Weak positive      20 - 39                           Moderate positive  40 - 59                           Strong positive        >59   Hepatitis B Surface Antibody     Status: None   Collection Time: 01/31/24 11:32 AM  Result Value Ref Range   Hep B Surface Ab, Qual Non Reactive     Comment:          Non Reactive: Not immune to HBV infection.            Anti-HBs undetectable or less than 10 mIU/mL.          Reactive: Evidence of HBV immunity.           Anti-HBs levels greater than 10 mIU/mL.   Measles/Mumps/Rubella Immunity     Status: None   Collection Time: 01/31/24 11:32 AM  Result Value Ref Range   Rubella Antibodies, IGG 1.10 Immune >0.99 index    Comment:                                 Non-immune       <0.90                                 Equivocal  0.90 - 0.99                                 Immune           >0.99    RUBEOLA AB, IGG >300.0 Immune >16.4 AU/mL    Comment:                                  Negative        <13.5                                  Equivocal 13.5 - 16.4                                  Positive        >16.4 Presence of antibodies to Rubeola is presumptive evidence of immunity except when acute infection is suspected.    MUMPS ABS, IGG 15.0 Immune >10.9 AU/mL    Comment:  Negative         <9.0                                 Equivocal  9.0 - 10.9                                 Positive        >10.9 A positive result generally indicates past exposure to Mumps virus or previous vaccination.       Assessment & Plan:  Referral sent to behavioral health. Lab work today  Problem List Items Addressed This Visit       Respiratory   OSA (obstructive sleep apnea) - Primary     Other   History of gastric bypass   Relevant Orders   Vitamin B12 (Completed)   Vitamin D  (25 hydroxy) (Completed)   History of gestational diabetes   Relevant Orders   CMP14+EGFR (Completed)   Hemoglobin A1c (Completed)   Attention deficit   Relevant Orders   Ambulatory referral to Behavioral Health   Other Visit Diagnoses       Body aches       Relevant Orders   CMP14+EGFR (Completed)   CRP (C-Reactive Protein) (Completed)   ANA, IFA (with reflex) (Completed)   Rheumatoid Arthritis Profile (Completed)     Memory loss       Relevant Orders   CBC with Differential/Platelet  (Completed)   CRP (C-Reactive Protein) (Completed)   ANA, IFA (with reflex) (Completed)   Rheumatoid Arthritis Profile (Completed)     Other fatigue       Relevant Orders   CMP14+EGFR (Completed)   TSH (Completed)   CBC with Differential/Platelet (Completed)   CRP (C-Reactive Protein) (Completed)   ANA, IFA (with reflex) (Completed)   Rheumatoid Arthritis Profile (Completed)     Diabetes mellitus screening       Relevant Orders   Hemoglobin A1c (Completed)     Thyroid disorder screening       Relevant Orders   TSH (Completed)     Lipid screening       Relevant Orders   Lipid Profile (Completed)     Immunity status testing       Relevant Orders   Hepatitis B Surface Antibody (Completed)   Measles/Mumps/Rubella Immunity (Completed)       Return in about 4 weeks (around 02/28/2024).   Total time spent: 25 minutes. This time includes review of previous notes and results and patient face to face interaction during today's visit.    Jeoffrey Pollen, NP  01/31/2024   This document may have been prepared by Dragon Voice Recognition software and as such may include unintentional dictation errors.

## 2024-01-31 NOTE — Patient Instructions (Addendum)
[facility] [address] [place], [place] [address]  Phone: [phone] Fax: [phone]

## 2024-02-01 LAB — MEASLES/MUMPS/RUBELLA IMMUNITY
MUMPS ABS, IGG: 15 [AU]/ml (ref >10.9)
RUBEOLA AB, IGG: >300 [AU]/ml (ref >16.4)
Rubella Antibodies, IGG: 1.1 {index} (ref >0.99)

## 2024-02-01 LAB — HEPATITIS B SURFACE ANTIBODY,QUALITATIVE: Hep B Surface Ab, Qual: NONREACTIVE

## 2024-02-02 ENCOUNTER — Other Ambulatory Visit: Payer: Self-pay | Admitting: Cardiology

## 2024-02-02 ENCOUNTER — Ambulatory Visit: Payer: Self-pay | Admitting: Cardiology

## 2024-02-02 LAB — CBC WITH DIFFERENTIAL/PLATELET
Basophils Absolute: 0 x10E3/uL (ref 0.0–0.2)
Basos: 0 %
EOS (ABSOLUTE): 0 x10E3/uL (ref 0.0–0.4)
Eos: 0 %
Hematocrit: 39.2 % (ref 34.0–46.6)
Hemoglobin: 13 g/dL (ref 11.1–15.9)
Immature Grans (Abs): 0 x10E3/uL (ref 0.0–0.1)
Immature Granulocytes: 0 %
Lymphocytes Absolute: 1.5 x10E3/uL (ref 0.7–3.1)
Lymphs: 28 %
MCH: 30.8 pg (ref 26.6–33.0)
MCHC: 33.2 g/dL (ref 31.5–35.7)
MCV: 93 fL (ref 79–97)
Monocytes Absolute: 0.3 x10E3/uL (ref 0.1–0.9)
Monocytes: 6 %
Neutrophils Absolute: 3.4 x10E3/uL (ref 1.4–7.0)
Neutrophils: 66 %
Platelets: 216 x10E3/uL (ref 150–450)
RBC: 4.22 x10E6/uL (ref 3.77–5.28)
RDW: 11.8 % (ref 11.7–15.4)
WBC: 5.1 x10E3/uL (ref 3.4–10.8)

## 2024-02-02 LAB — CMP14+EGFR
ALT: 23 IU/L (ref 0–32)
AST: 31 IU/L (ref 0–40)
Albumin: 4.3 g/dL (ref 4.0–5.0)
Alkaline Phosphatase: 170 IU/L — ABNORMAL HIGH (ref 41–116)
BUN/Creatinine Ratio: 12 (ref 9–23)
BUN: 10 mg/dL (ref 6–20)
Bilirubin Total: 0.5 mg/dL (ref 0.0–1.2)
CO2: 19 mmol/L — ABNORMAL LOW (ref 20–29)
Calcium: 9.1 mg/dL (ref 8.7–10.2)
Chloride: 105 mmol/L (ref 96–106)
Creatinine, Ser: 0.82 mg/dL (ref 0.57–1.00)
Globulin, Total: 2.4 g/dL (ref 1.5–4.5)
Glucose: 89 mg/dL (ref 70–99)
Potassium: 4.4 mmol/L (ref 3.5–5.2)
Sodium: 137 mmol/L (ref 134–144)
Total Protein: 6.7 g/dL (ref 6.0–8.5)
eGFR: 99 mL/min/1.73 (ref >59)

## 2024-02-02 LAB — TSH: TSH: 3.11 u[IU]/mL (ref 0.450–4.500)

## 2024-02-02 LAB — HEMOGLOBIN A1C
Est. average glucose Bld gHb Est-mCnc: 85 mg/dL
Hgb A1c MFr Bld: 4.6 % — ABNORMAL LOW (ref 4.8–5.6)

## 2024-02-02 LAB — C-REACTIVE PROTEIN: CRP: <1 mg/L (ref 0–10)

## 2024-02-02 LAB — LIPID PANEL
Chol/HDL Ratio: 2.6 ratio (ref 0.0–4.4)
Cholesterol, Total: 161 mg/dL (ref 100–199)
HDL: 61 mg/dL (ref >39)
LDL Chol Calc (NIH): 83 mg/dL (ref 0–99)
Triglycerides: 90 mg/dL (ref 0–149)
VLDL Cholesterol Cal: 17 mg/dL (ref 5–40)

## 2024-02-02 LAB — ANTINUCLEAR ANTIBODIES, IFA: ANA Titer 1: NEGATIVE

## 2024-02-02 LAB — RHEUMATOID ARTHRITIS PROFILE
Cyclic Citrullin Peptide Ab: 7 U (ref 0–19)
Rheumatoid fact SerPl-aCnc: <10 [IU]/mL (ref <14.0)

## 2024-02-02 LAB — VITAMIN B12: Vitamin B-12: 588 pg/mL (ref 232–1245)

## 2024-02-02 LAB — VITAMIN D 25 HYDROXY (VIT D DEFICIENCY, FRACTURES): Vit D, 25-Hydroxy: 16.7 ng/mL — ABNORMAL LOW (ref 30.0–100.0)

## 2024-02-02 MED ORDER — VITAMIN D (ERGOCALCIFEROL) 1.25 MG (50000 UNIT) PO CAPS: 50000.0000 [IU] | ORAL_CAPSULE | ORAL | 5 refills | Status: AC

## 2024-02-02 NOTE — Progress Notes (Signed)
 Pt.notified

## 2024-02-04 ENCOUNTER — Other Ambulatory Visit: Payer: Self-pay | Admitting: Cardiology

## 2024-02-28 ENCOUNTER — Encounter: Payer: Self-pay | Admitting: Cardiology

## 2024-02-28 ENCOUNTER — Ambulatory Visit: Payer: Self-pay | Admitting: Cardiology

## 2024-02-28 VITALS — BP 104/78 | HR 75 | Ht 67.0 in | Wt 213.8 lb

## 2024-02-28 DIAGNOSIS — R8290 Unspecified abnormal findings in urine: Secondary | ICD-10-CM

## 2024-02-28 DIAGNOSIS — E162 Hypoglycemia, unspecified: Secondary | ICD-10-CM

## 2024-02-28 NOTE — Progress Notes (Unsigned)
 "  Established Patient Office Visit  Subjective:  Patient ID: Shirley Hayes, female    DOB: 1995/08/05  Age: 29 y.o. MRN: [id number]  Chief Complaint  Patient presents with   Follow-up    4 week follow up    Patient in office for 4 week follow up.   Urinary Tract Infection  This is a new problem. The current episode started more than 1 month ago. The problem occurs every urination. The problem has been unchanged. The patient is experiencing no pain. She has tried nothing for the symptoms.    No other concerns at this time.   Past Medical History:  Diagnosis Date   Anxiety    Asthma    physical induced asthma   Depression    GERD (gastroesophageal reflux disease)    Gestational diabetes    Hypothyroidism    Indication for care in labor and delivery, antepartum 02/11/2021   Mental disorder    S/P cesarean section 09/03/2015    Past Surgical History:  Procedure Laterality Date   CESAREAN SECTION  [id number]   CESAREAN SECTION N/A 09/03/2015   Procedure: CESAREAN SECTION;  Surgeon: Glory High, MD;  Location: [facility] OR[facility];  Service: Obstetrics;  Laterality: N/A;   CHOLECYSTECTOMY N/A 01/01/2015   Procedure: LAPAROSCOPIC CHOLECYSTECTOMY WITH INTRAOPERATIVE CHOLANGIOGRAM, laparoscopic incisional hernia repair;  Surgeon: Louanne KANDICE Muse, MD;  Location: [facility] OR[facility];  Service: General;  Laterality: N/A;   GASTRIC BYPASS  12/24/2019   LAPAROSCOPY  02/22/2012    Social History   Socioeconomic History   Marital status: Married    Spouse name: Not on file   Number of children: Not on file   Years of education: Not on file   Highest education level: Not on file  Occupational History   Not on file  Tobacco Use   Smoking status: Former    Current packs/day: 0.00    Average packs/day: 1 pack/day for 3.0 years (3.0 ttl pk-yrs)    Types: Cigarettes    Start date: 07/23/2010    Quit date: 07/22/2013    Years since quitting: 10.6   Smokeless tobacco: Never  Vaping Use    Vaping status: Never Used  Substance and Sexual Activity   Alcohol use: No    Alcohol/week: 0.0 standard drinks of alcohol   Drug use: No   Sexual activity: Yes    Birth control/protection: I.U.D.    Comment: mirena   Other Topics Concern   Not on file  Social History Narrative   Not on file   Social Drivers of Health   Tobacco Use: Medium Risk (02/28/2024)   Patient History    Smoking Tobacco Use: Former    Smokeless Tobacco Use: Never    Passive Exposure: Not on Actuary Strain: Not on file  Food Insecurity: Not on file  Transportation Needs: Not on file  Physical Activity: Not on file  Stress: Not on file  Social Connections: Not on file  Intimate Partner Violence: Not on file  Depression (PHQ2-9): Low Risk (05/03/2021)   Depression (PHQ2-9)    PHQ-2 Score: 4  Alcohol Screen: Not on file  Housing: Not on file  Utilities: Not on file  Health Literacy: Not on file    Family History  Problem Relation Age of Onset   Diabetes Mother    Hypertension Mother    Hypertension Sister    Hypertension Maternal Grandmother    Hypertension Other    Breast cancer Neg Hx    Ovarian  cancer Neg Hx    Colon cancer Neg Hx     Allergies[1]  Show/hide medication list[2]  Review of Systems  Constitutional: Negative.   HENT: Negative.    Eyes: Negative.   Respiratory: Negative.  Negative for shortness of breath.   Cardiovascular: Negative.  Negative for chest pain.  Gastrointestinal: Negative.  Negative for abdominal pain, constipation and diarrhea.  Genitourinary: Negative.   Musculoskeletal:  Negative for joint pain and myalgias.  Skin: Negative.   Neurological: Negative.  Negative for dizziness and headaches.  Endo/Heme/Allergies: Negative.   All other systems reviewed and are negative.      Objective:   BP 104/78   Pulse 75   Ht 5' 7 (1.702 m)   Wt 213 lb 12.8 oz (97 kg)   SpO2 99%   BMI 33.49 kg/m   Vitals:   02/28/24 1053  BP: 104/78   Pulse: 75  Height: 5' 7 (1.702 m)  Weight: 213 lb 12.8 oz (97 kg)  SpO2: 99%  BMI (Calculated): 33.48    Physical Exam Vitals and nursing note reviewed.  Constitutional:      Appearance: Normal appearance. She is normal weight.  HENT:     Head: Normocephalic and atraumatic.     Nose: Nose normal.     Mouth/Throat:     Mouth: Mucous membranes are moist.  Eyes:     Extraocular Movements: Extraocular movements intact.     Conjunctiva/sclera: Conjunctivae normal.     Pupils: Pupils are equal, round, and reactive to light.  Cardiovascular:     Rate and Rhythm: Normal rate and regular rhythm.     Pulses: Normal pulses.     Heart sounds: Normal heart sounds.  Pulmonary:     Effort: Pulmonary effort is normal.     Breath sounds: Normal breath sounds.  Abdominal:     General: Abdomen is flat. Bowel sounds are normal.     Palpations: Abdomen is soft.  Musculoskeletal:        General: Normal range of motion.     Cervical back: Normal range of motion.  Skin:    General: Skin is warm and dry.  Neurological:     General: No focal deficit present.     Mental Status: She is alert and oriented to person, place, and time.  Psychiatric:        Mood and Affect: Mood normal.        Behavior: Behavior normal.        Thought Content: Thought content normal.        Judgment: Judgment normal.      No results found for any visits on 02/28/24.  Recent Results (from the past 2160 hours)  CMP14+EGFR     Status: Abnormal   Collection Time: 01/31/24 11:31 AM  Result Value Ref Range   Glucose 89 70 - 99 mg/dL   BUN 10 6 - 20 mg/dL   Creatinine, Ser 9.17 0.57 - 1.00 mg/dL   eGFR 99 >40 fO/fpw/8.26   BUN/Creatinine Ratio 12 9 - 23   Sodium 137 134 - 144 mmol/L   Potassium 4.4 3.5 - 5.2 mmol/L   Chloride 105 96 - 106 mmol/L   CO2 19 (L) 20 - 29 mmol/L   Calcium 9.1 8.7 - 10.2 mg/dL   Total Protein 6.7 6.0 - 8.5 g/dL   Albumin 4.3 4.0 - 5.0 g/dL   Globulin, Total 2.4 1.5 - 4.5 g/dL    Bilirubin Total 0.5 0.0 - 1.2 mg/dL  Alkaline Phosphatase 170 (H) 41 - 116 IU/L   AST 31 0 - 40 IU/L   ALT 23 0 - 32 IU/L  Lipid Profile     Status: None   Collection Time: 01/31/24 11:31 AM  Result Value Ref Range   Cholesterol, Total 161 100 - 199 mg/dL   Triglycerides 90 0 - 149 mg/dL   HDL 61 >60 mg/dL   VLDL Cholesterol Cal 17 5 - 40 mg/dL   LDL Chol Calc (NIH) 83 0 - 99 mg/dL   Chol/HDL Ratio 2.6 0.0 - 4.4 ratio    Comment:                                   T. Chol/HDL Ratio                                             Men  Women                               1/2 Avg.Risk  3.4    3.3                                   Avg.Risk  5.0    4.4                                2X Avg.Risk  9.6    7.1                                3X Avg.Risk 23.4   11.0   Hemoglobin A1c     Status: Abnormal   Collection Time: 01/31/24 11:31 AM  Result Value Ref Range   Hgb A1c MFr Bld 4.6 (L) 4.8 - 5.6 %    Comment:          Prediabetes: 5.7 - 6.4          Diabetes: >6.4          Glycemic control for adults with diabetes: <7.0    Est. average glucose Bld gHb Est-mCnc 85 mg/dL  TSH     Status: None   Collection Time: 01/31/24 11:31 AM  Result Value Ref Range   TSH 3.110 0.450 - 4.500 uIU/mL  CBC with Differential/Platelet     Status: None   Collection Time: 01/31/24 11:31 AM  Result Value Ref Range   WBC 5.1 3.4 - 10.8 x10E3/uL   RBC 4.22 3.77 - 5.28 x10E6/uL   Hemoglobin 13.0 11.1 - 15.9 g/dL   Hematocrit 60.7 65.9 - 46.6 %   MCV 93 79 - 97 fL   MCH 30.8 26.6 - 33.0 pg   MCHC 33.2 31.5 - 35.7 g/dL   RDW 88.1 88.2 - 84.5 %   Platelets 216 150 - 450 x10E3/uL   Neutrophils 66 Not Estab. %   Lymphs 28 Not Estab. %   Monocytes 6 Not Estab. %   Eos 0 Not Estab. %   Basos 0 Not Estab. %   Neutrophils Absolute 3.4 1.4 - 7.0 x10E3/uL   Lymphocytes Absolute  1.5 0.7 - 3.1 x10E3/uL   Monocytes Absolute 0.3 0.1 - 0.9 x10E3/uL   EOS (ABSOLUTE) 0.0 0.0 - 0.4 x10E3/uL   Basophils Absolute 0.0 0.0  - 0.2 x10E3/uL   Immature Granulocytes 0 Not Estab. %   Immature Grans (Abs) 0.0 0.0 - 0.1 x10E3/uL  Vitamin B12     Status: None   Collection Time: 01/31/24 11:31 AM  Result Value Ref Range   Vitamin B-12 588 232 - 1,245 pg/mL  Vitamin D  (25 hydroxy)     Status: Abnormal   Collection Time: 01/31/24 11:31 AM  Result Value Ref Range   Vit D, 25-Hydroxy 16.7 (L) 30.0 - 100.0 ng/mL    Comment: Vitamin D  deficiency has been defined by the Institute of Medicine and an Endocrine Society practice guideline as a level of serum 25-OH vitamin D  less than 20 ng/mL (1,2). The Endocrine Society went on to further define vitamin D  insufficiency as a level between 21 and 29 ng/mL (2). 1. IOM (Institute of Medicine). 2010. Dietary reference    intakes for calcium and D. [place]: The    [employer]. 2. Holick MF, Binkley Woodbine, Bischoff-Ferrari HA, et al.    Evaluation, treatment, and prevention of vitamin D     deficiency: an Endocrine Society clinical practice    guideline. JCEM. 2011 Jul; 96(7):1911-30.   CRP (C-Reactive Protein)     Status: None   Collection Time: 01/31/24 11:31 AM  Result Value Ref Range   CRP <1 0 - 10 mg/L  ANA, IFA (with reflex)     Status: None   Collection Time: 01/31/24 11:31 AM  Result Value Ref Range   ANA Titer 1 Negative     Comment:                                      Negative   <1:80                                      Borderline  1:80                                      Positive   >1:80 ICAP nomenclature: AC-0 For more information about Hep-2 cell patterns use ANApatterns.org, the official website for the International Consensus on Antinuclear Antibody (ANA) Patterns (ICAP).   Rheumatoid Arthritis Profile     Status: None   Collection Time: 01/31/24 11:31 AM  Result Value Ref Range   Rheumatoid fact SerPl-aCnc <10.0 <14.0 IU/mL   Cyclic Citrullin Peptide Ab 7 0 - 19 units    Comment:                           Negative                <20                           Weak positive      20 - 39                           Moderate positive  40 - 59  Strong positive        >59   Hepatitis B Surface Antibody     Status: None   Collection Time: 01/31/24 11:32 AM  Result Value Ref Range   Hep B Surface Ab, Qual Non Reactive     Comment:          Non Reactive: Not immune to HBV infection.           Anti-HBs undetectable or less than 10 mIU/mL.          Reactive: Evidence of HBV immunity.           Anti-HBs levels greater than 10 mIU/mL.   Measles/Mumps/Rubella Immunity     Status: None   Collection Time: 01/31/24 11:32 AM  Result Value Ref Range   Rubella Antibodies, IGG 1.10 Immune >0.99 index    Comment:                                 Non-immune       <0.90                                 Equivocal  0.90 - 0.99                                 Immune           >0.99    RUBEOLA AB, IGG >300.0 Immune >16.4 AU/mL    Comment:                                  Negative        <13.5                                  Equivocal 13.5 - 16.4                                  Positive        >16.4 Presence of antibodies to Rubeola is presumptive evidence of immunity except when acute infection is suspected.    MUMPS ABS, IGG 15.0 Immune >10.9 AU/mL    Comment:                                 Negative         <9.0                                 Equivocal  9.0 - 10.9                                 Positive        >10.9 A positive result generally indicates past exposure to Mumps virus or previous vaccination.       Assessment & Plan:   Problem List Items Addressed This Visit   None Visit Diagnoses       Hypoglycemia    -  Primary  Relevant Orders   Ambulatory referral to Endocrinology     Foul smelling urine       Relevant Orders   POCT Urinalysis Dipstick (18997)   Urine Culture       Return in about 4 weeks (around 03/27/2024).   Total time spent: 25 minutes. This time includes review  of previous notes and results and patient face to face interaction during today's visit.    Jeoffrey Pollen, NP  02/28/2024   This document may have been prepared by Dragon Voice Recognition software and as such may include unintentional dictation errors.     [1]  Allergies Allergen Reactions   Chlorhexidine Gluconate Itching    severe   Tape Rash  [2]  Outpatient Medications Prior to Visit  Medication Sig   benztropine  (COGENTIN ) 1 MG tablet Take 1 tablet (1 mg total) by mouth 2 (two) times daily. (Patient taking differently: Take 1 mg by mouth as needed.)   FLUoxetine  (PROZAC ) 20 MG capsule TAKE 1 CAPSULE(20 MG) BY MOUTH DAILY   levonorgestrel  (MIRENA , 52 MG,) 20 MCG/DAY IUD 1 each by Intrauterine route once.   Vitamin D , Ergocalciferol , (DRISDOL ) 1.25 MG (50000 UNIT) CAPS capsule Take 1 capsule (50,000 Units total) by mouth every 7 (seven) days.   No facility-administered medications prior to visit.   "

## 2024-03-04 ENCOUNTER — Other Ambulatory Visit: Payer: Self-pay | Admitting: Cardiology

## 2024-03-04 ENCOUNTER — Ambulatory Visit: Payer: Self-pay | Admitting: Cardiology

## 2024-03-04 LAB — URINE CULTURE

## 2024-03-04 MED ORDER — AMOXICILLIN-POT CLAVULANATE 875-125 MG PO TABS: 1.0000 | ORAL_TABLET | Freq: Two times a day (BID) | ORAL | 0 refills | Status: AC

## 2024-03-24 ENCOUNTER — Encounter: Payer: Self-pay | Admitting: Cardiology

## 2024-03-25 ENCOUNTER — Other Ambulatory Visit: Payer: Self-pay

## 2024-03-25 MED ORDER — FLUOXETINE HCL 20 MG PO CAPS: 20.0000 mg | ORAL_CAPSULE | Freq: Every day | ORAL | 3 refills | Status: AC

## 2024-03-27 ENCOUNTER — Ambulatory Visit: Payer: Self-pay | Admitting: Cardiology
